# Patient Record
Sex: Male | Born: 1950 | Hispanic: No | Marital: Married | Smoking: Former smoker
Health system: Southern US, Community
[De-identification: ages and names within clinical notes are randomized; demographics above are authoritative.]

## PROBLEM LIST (undated history)

## (undated) DIAGNOSIS — I10 Essential (primary) hypertension: Secondary | ICD-10-CM

## (undated) DIAGNOSIS — I4891 Unspecified atrial fibrillation: Secondary | ICD-10-CM

## (undated) DIAGNOSIS — E785 Hyperlipidemia, unspecified: Secondary | ICD-10-CM

## (undated) DIAGNOSIS — K219 Gastro-esophageal reflux disease without esophagitis: Secondary | ICD-10-CM

## (undated) DIAGNOSIS — N400 Enlarged prostate without lower urinary tract symptoms: Secondary | ICD-10-CM

## (undated) DIAGNOSIS — M1990 Unspecified osteoarthritis, unspecified site: Secondary | ICD-10-CM

## (undated) DIAGNOSIS — R0600 Dyspnea, unspecified: Secondary | ICD-10-CM

## (undated) DIAGNOSIS — D72819 Decreased white blood cell count, unspecified: Secondary | ICD-10-CM

## (undated) HISTORY — PX: OTHER SURGICAL HISTORY: SHX169

---

## 1999-08-05 ENCOUNTER — Encounter: Admission: RE | Admit: 1999-08-05 | Discharge: 1999-08-05 | Payer: Self-pay | Admitting: *Deleted

## 2000-07-17 ENCOUNTER — Ambulatory Visit (HOSPITAL_COMMUNITY): Admission: RE | Admit: 2000-07-17 | Discharge: 2000-07-17 | Payer: Self-pay | Admitting: Gastroenterology

## 2000-07-17 ENCOUNTER — Encounter: Payer: Self-pay | Admitting: Gastroenterology

## 2005-01-22 ENCOUNTER — Ambulatory Visit: Payer: Self-pay | Admitting: Internal Medicine

## 2006-07-31 ENCOUNTER — Other Ambulatory Visit: Payer: Self-pay

## 2006-07-31 ENCOUNTER — Emergency Department: Payer: Self-pay | Admitting: Emergency Medicine

## 2006-10-09 ENCOUNTER — Ambulatory Visit: Payer: Self-pay | Admitting: Unknown Physician Specialty

## 2009-06-04 ENCOUNTER — Ambulatory Visit: Payer: Self-pay | Admitting: Internal Medicine

## 2011-02-10 ENCOUNTER — Ambulatory Visit: Payer: Self-pay | Admitting: Physical Medicine and Rehabilitation

## 2011-09-24 ENCOUNTER — Encounter: Payer: Self-pay | Admitting: Gastroenterology

## 2012-05-19 ENCOUNTER — Encounter: Payer: Self-pay | Admitting: Gastroenterology

## 2012-05-26 ENCOUNTER — Telehealth: Payer: Self-pay | Admitting: Gastroenterology

## 2012-05-26 NOTE — Telephone Encounter (Signed)
pt received recall letter.  He said he just had it done a couple weeks ago at another facility.

## 2014-08-11 ENCOUNTER — Other Ambulatory Visit: Payer: Self-pay | Admitting: Orthopedic Surgery

## 2014-08-11 DIAGNOSIS — M25561 Pain in right knee: Secondary | ICD-10-CM

## 2014-08-18 ENCOUNTER — Other Ambulatory Visit: Payer: Self-pay

## 2014-08-28 ENCOUNTER — Ambulatory Visit
Admission: RE | Admit: 2014-08-28 | Discharge: 2014-08-28 | Disposition: A | Discharge status: Home / Self Care | Payer: BC Managed Care – PPO | Source: Ambulatory Visit | Attending: Orthopedic Surgery | Admitting: Orthopedic Surgery

## 2014-08-28 DIAGNOSIS — M25561 Pain in right knee: Secondary | ICD-10-CM

## 2016-05-14 ENCOUNTER — Other Ambulatory Visit: Payer: Self-pay | Admitting: Student

## 2016-05-14 DIAGNOSIS — M19011 Primary osteoarthritis, right shoulder: Secondary | ICD-10-CM

## 2016-05-14 DIAGNOSIS — M7581 Other shoulder lesions, right shoulder: Secondary | ICD-10-CM

## 2016-05-28 ENCOUNTER — Ambulatory Visit
Admission: RE | Admit: 2016-05-28 | Discharge: 2016-05-28 | Disposition: A | Discharge status: Home / Self Care | Payer: Medicare Other | Source: Ambulatory Visit | Attending: Student | Admitting: Student

## 2016-05-28 DIAGNOSIS — M7581 Other shoulder lesions, right shoulder: Secondary | ICD-10-CM | POA: Diagnosis present

## 2016-05-28 DIAGNOSIS — M19011 Primary osteoarthritis, right shoulder: Secondary | ICD-10-CM | POA: Insufficient documentation

## 2017-10-26 ENCOUNTER — Encounter: Payer: Self-pay | Admitting: *Deleted

## 2017-10-27 ENCOUNTER — Encounter: Payer: Self-pay | Admitting: *Deleted

## 2017-10-27 ENCOUNTER — Ambulatory Visit
Admission: RE | Admit: 2017-10-27 | Discharge: 2017-10-27 | Disposition: A | Discharge status: Home / Self Care | Payer: Medicare Other | Source: Ambulatory Visit | Attending: Unknown Physician Specialty | Admitting: Unknown Physician Specialty

## 2017-10-27 ENCOUNTER — Ambulatory Visit: Payer: Medicare Other | Admitting: Anesthesiology

## 2017-10-27 ENCOUNTER — Encounter
Admission: RE | Disposition: A | Discharge status: Home / Self Care | Payer: Self-pay | Source: Ambulatory Visit | Attending: Unknown Physician Specialty

## 2017-10-27 DIAGNOSIS — I10 Essential (primary) hypertension: Secondary | ICD-10-CM | POA: Diagnosis not present

## 2017-10-27 DIAGNOSIS — Z1211 Encounter for screening for malignant neoplasm of colon: Secondary | ICD-10-CM | POA: Insufficient documentation

## 2017-10-27 DIAGNOSIS — Z8249 Family history of ischemic heart disease and other diseases of the circulatory system: Secondary | ICD-10-CM | POA: Diagnosis not present

## 2017-10-27 DIAGNOSIS — Z79899 Other long term (current) drug therapy: Secondary | ICD-10-CM | POA: Insufficient documentation

## 2017-10-27 DIAGNOSIS — Z96611 Presence of right artificial shoulder joint: Secondary | ICD-10-CM | POA: Insufficient documentation

## 2017-10-27 DIAGNOSIS — Z800 Family history of malignant neoplasm of digestive organs: Secondary | ICD-10-CM | POA: Insufficient documentation

## 2017-10-27 DIAGNOSIS — R001 Bradycardia, unspecified: Secondary | ICD-10-CM | POA: Insufficient documentation

## 2017-10-27 DIAGNOSIS — R351 Nocturia: Secondary | ICD-10-CM | POA: Insufficient documentation

## 2017-10-27 DIAGNOSIS — Z801 Family history of malignant neoplasm of trachea, bronchus and lung: Secondary | ICD-10-CM | POA: Diagnosis not present

## 2017-10-27 DIAGNOSIS — Z87891 Personal history of nicotine dependence: Secondary | ICD-10-CM | POA: Diagnosis not present

## 2017-10-27 DIAGNOSIS — E785 Hyperlipidemia, unspecified: Secondary | ICD-10-CM | POA: Diagnosis not present

## 2017-10-27 DIAGNOSIS — K219 Gastro-esophageal reflux disease without esophagitis: Secondary | ICD-10-CM | POA: Diagnosis not present

## 2017-10-27 DIAGNOSIS — I480 Paroxysmal atrial fibrillation: Secondary | ICD-10-CM | POA: Diagnosis not present

## 2017-10-27 DIAGNOSIS — K640 First degree hemorrhoids: Secondary | ICD-10-CM | POA: Insufficient documentation

## 2017-10-27 DIAGNOSIS — D123 Benign neoplasm of transverse colon: Secondary | ICD-10-CM | POA: Insufficient documentation

## 2017-10-27 DIAGNOSIS — R0600 Dyspnea, unspecified: Secondary | ICD-10-CM | POA: Diagnosis not present

## 2017-10-27 DIAGNOSIS — Z808 Family history of malignant neoplasm of other organs or systems: Secondary | ICD-10-CM | POA: Insufficient documentation

## 2017-10-27 DIAGNOSIS — M1990 Unspecified osteoarthritis, unspecified site: Secondary | ICD-10-CM | POA: Insufficient documentation

## 2017-10-27 DIAGNOSIS — Z86010 Personal history of colonic polyps: Secondary | ICD-10-CM | POA: Insufficient documentation

## 2017-10-27 DIAGNOSIS — N400 Enlarged prostate without lower urinary tract symptoms: Secondary | ICD-10-CM | POA: Insufficient documentation

## 2017-10-27 DIAGNOSIS — Z833 Family history of diabetes mellitus: Secondary | ICD-10-CM | POA: Insufficient documentation

## 2017-10-27 HISTORY — DX: Hyperlipidemia, unspecified: E78.5

## 2017-10-27 HISTORY — PX: ESOPHAGOGASTRODUODENOSCOPY (EGD) WITH PROPOFOL: SHX5813

## 2017-10-27 HISTORY — DX: Unspecified osteoarthritis, unspecified site: M19.90

## 2017-10-27 HISTORY — DX: Benign prostatic hyperplasia without lower urinary tract symptoms: N40.0

## 2017-10-27 HISTORY — DX: Essential (primary) hypertension: I10

## 2017-10-27 HISTORY — DX: Dyspnea, unspecified: R06.00

## 2017-10-27 HISTORY — PX: COLONOSCOPY WITH PROPOFOL: SHX5780

## 2017-10-27 HISTORY — DX: Unspecified atrial fibrillation: I48.91

## 2017-10-27 HISTORY — DX: Gastro-esophageal reflux disease without esophagitis: K21.9

## 2017-10-27 HISTORY — DX: Decreased white blood cell count, unspecified: D72.819

## 2017-10-27 SURGERY — COLONOSCOPY WITH PROPOFOL
Anesthesia: General

## 2017-10-27 MED ORDER — FENTANYL CITRATE (PF) 100 MCG/2ML IJ SOLN
INTRAMUSCULAR | Status: DC | PRN
  Administered 2017-10-27: 25 ug via INTRAVENOUS
  Administered 2017-10-27: 50 ug via INTRAVENOUS
  Administered 2017-10-27: 25 ug via INTRAVENOUS

## 2017-10-27 MED ORDER — PROPOFOL 500 MG/50ML IV EMUL
INTRAVENOUS | Status: DC | PRN
  Administered 2017-10-27: 150 ug/kg/min via INTRAVENOUS

## 2017-10-27 MED ORDER — SODIUM CHLORIDE 0.9 % IV SOLN
INTRAVENOUS | Status: DC
  Administered 2017-10-27: 14:00:00 via INTRAVENOUS

## 2017-10-27 MED ORDER — FENTANYL CITRATE (PF) 100 MCG/2ML IJ SOLN
INTRAMUSCULAR | Status: AC
  Filled 2017-10-27: qty 2

## 2017-10-27 MED ORDER — EPHEDRINE SULFATE 50 MG/ML IJ SOLN
INTRAMUSCULAR | Status: DC | PRN
  Administered 2017-10-27 (×2): 5 mg via INTRAVENOUS

## 2017-10-27 MED ORDER — SODIUM CHLORIDE 0.9 % IJ SOLN
INTRAMUSCULAR | Status: AC
  Filled 2017-10-27: qty 10

## 2017-10-27 MED ORDER — PIPERACILLIN-TAZOBACTAM 3.375 G IVPB 30 MIN
3.3750 g | Freq: Once | INTRAVENOUS | Status: AC
  Administered 2017-10-27: 3.375 g via INTRAVENOUS
  Filled 2017-10-27: qty 50

## 2017-10-27 MED ORDER — LIDOCAINE HCL (PF) 2 % IJ SOLN
INTRAMUSCULAR | Status: AC
  Filled 2017-10-27: qty 10

## 2017-10-27 MED ORDER — PROPOFOL 10 MG/ML IV BOLUS
INTRAVENOUS | Status: DC | PRN
  Administered 2017-10-27: 50 mg via INTRAVENOUS
  Administered 2017-10-27 (×2): 20 mg via INTRAVENOUS

## 2017-10-27 MED ORDER — LIDOCAINE HCL (CARDIAC) 20 MG/ML IV SOLN
INTRAVENOUS | Status: DC | PRN
  Administered 2017-10-27: 50 mg via INTRAVENOUS

## 2017-10-27 MED ORDER — GLYCOPYRROLATE 0.2 MG/ML IV SOSY
PREFILLED_SYRINGE | INTRAVENOUS | Status: DC | PRN
  Administered 2017-10-27: .1 mg via INTRAVENOUS

## 2017-10-27 MED ORDER — PIPERACILLIN-TAZOBACTAM 3.375 G IVPB
INTRAVENOUS | Status: AC
  Administered 2017-10-27: 3.375 g via INTRAVENOUS
  Filled 2017-10-27: qty 50

## 2017-10-27 MED ORDER — PROPOFOL 500 MG/50ML IV EMUL
INTRAVENOUS | Status: AC
  Filled 2017-10-27: qty 50

## 2017-10-27 MED ORDER — GLYCOPYRROLATE 0.2 MG/ML IJ SOLN
INTRAMUSCULAR | Status: AC
  Filled 2017-10-27: qty 1

## 2017-10-27 MED ORDER — EPHEDRINE SULFATE 50 MG/ML IJ SOLN
INTRAMUSCULAR | Status: AC
  Filled 2017-10-27: qty 1

## 2017-10-27 MED ORDER — SODIUM CHLORIDE 0.9 % IV SOLN: INTRAVENOUS | Status: DC

## 2017-10-27 NOTE — Op Note (Signed)
[facility] Patient Name: Jeffery Livingston Procedure Date: 10/27/2017 1:53 PM MRN: [id number] Account #: [id number] Date of Birth: Jan 27, 1951 Admit Type: Outpatient Age: 67 Room: [facility] ENDO ROOM 1 Gender: Male Note Status: Finalized Procedure:            Upper GI endoscopy Indications:          Suspected gastro-esophageal reflux disease Providers:            Manya Silvas, MD Referring MD:         Ramonita Lab, MD (Referring MD) Medicines:            Propofol per Anesthesia Complications:        No immediate complications. Procedure:            Pre-Anesthesia Assessment:                       - After reviewing the risks and benefits, the patient                        was deemed in satisfactory condition to undergo the                        procedure.                       After obtaining informed consent, the endoscope was                        passed under direct vision. Throughout the procedure,                        the patient's blood pressure, pulse, and oxygen                        saturations were monitored continuously. The Endoscope                        was introduced through the mouth, and advanced to the                        second part of duodenum. The upper GI endoscopy was                        accomplished without difficulty. The patient tolerated                        the procedure well. Findings:      The examined esophagus was normal. Biopsies were taken with a cold       forceps for histology. Done at [facility]. GEJ at 41cm.      The entire examined stomach was normal. Biopsies were taken with a cold       forceps for Helicobacter pylori testing. Biopsies were taken with a cold       forceps for histology.      The examined duodenum was normal. Impression:           - Normal esophagus. Biopsied.                       - Normal stomach. Biopsied.                       -  Normal examined duodenum. Recommendation:       -  Await pathology results.                       - Perform a colonoscopy as previously scheduled. Manya Silvas, MD 10/27/2017 2:16:53 PM This report has been signed electronically. Number of Addenda: 0 Note Initiated On: 10/27/2017 1:53 PM      [facility]

## 2017-10-27 NOTE — Anesthesia Post-op Follow-up Note (Signed)
Anesthesia QCDR form completed.        

## 2017-10-27 NOTE — Op Note (Signed)
[name] Patient Name: Jeffery Livingston Procedure Date: 10/27/2017 1:52 PM MRN: [id number] Account #: [id number] Date of Birth: 1951-06-27 Admit Type: Outpatient Age: 67 Room: [facility] ENDO ROOM 1 Gender: Male Note Status: Finalized Procedure:            Colonoscopy Indications:          High risk colon cancer surveillance: Personal history                        of colonic polyps Providers:            Manya Silvas, MD Referring MD:         Ramonita Lab, MD (Referring MD) Medicines:            Propofol per Anesthesia Complications:        No immediate complications. Procedure:            Pre-Anesthesia Assessment:                       - After reviewing the risks and benefits, the patient                        was deemed in satisfactory condition to undergo the                        procedure.                       After obtaining informed consent, the colonoscope was                        passed under direct vision. Throughout the procedure,                        the patient's blood pressure, pulse, and oxygen                        saturations were monitored continuously. The                        Colonoscope was introduced through the anus and                        advanced to the the cecum, identified by appendiceal                        orifice and ileocecal valve. The colonoscopy was                        performed without difficulty. The patient tolerated the                        procedure well. The quality of the bowel preparation                        was good. Findings:      A diminutive polyp was found in the transverse colon. The polyp was       sessile. The polyp was removed with a jumbo cold forceps. Resection and       retrieval were complete.      Internal hemorrhoids were found during  endoscopy. The hemorrhoids were       small and Grade I (internal hemorrhoids that do not prolapse).      The exam was otherwise  without abnormality. Impression:           - One diminutive polyp in the transverse colon, removed                        with a jumbo cold forceps. Resected and retrieved.                       - Internal hemorrhoids.                       - The examination was otherwise normal. Recommendation:       - Await pathology results. Manya Silvas, MD 10/27/2017 2:40:09 PM This report has been signed electronically. Number of Addenda: 0 Note Initiated On: 10/27/2017 1:52 PM Scope Withdrawal Time: 0 hours 10 minutes 23 seconds  Total Procedure Duration: 0 hours 17 minutes 24 seconds       [facility]

## 2017-10-27 NOTE — Transfer of Care (Signed)
Immediate Anesthesia Transfer of Care Note  Patient: Jeffery Livingston  Procedure(s) Performed: COLONOSCOPY WITH PROPOFOL (N/A ) ESOPHAGOGASTRODUODENOSCOPY (EGD) WITH PROPOFOL (N/A )  Patient Location: PACU  Anesthesia Type:General  Level of Consciousness: sedated  Airway & Oxygen Therapy: Patient Spontanous Breathing and Patient connected to nasal cannula oxygen  Post-op Assessment: Report given to RN and Post -op Vital signs reviewed and stable  Post vital signs: Reviewed and stable  Last Vitals:  Vitals Value Taken Time  BP 112/65 10/27/2017  2:43 PM  Temp 36.1 C 10/27/2017  2:43 PM  Pulse 69 10/27/2017  2:43 PM  Resp 15 10/27/2017  2:43 PM  SpO2 100 % 10/27/2017  2:43 PM  Vitals shown include unvalidated device data.  Last Pain:  Vitals:   10/27/17 1443  TempSrc: Tympanic  PainSc: 0-No pain         Complications: No apparent anesthesia complications

## 2017-10-27 NOTE — Anesthesia Procedure Notes (Signed)
Procedure Name: MAC Date/Time: 10/27/2017 2:05 PM Performed by: Rudean Hitt, CRNA Pre-anesthesia Checklist: Patient identified, Emergency Drugs available, Suction available, Patient being monitored and Timeout performed Patient Re-evaluated:Patient Re-evaluated prior to induction Oxygen Delivery Method: Nasal cannula Induction Type: IV induction

## 2017-10-27 NOTE — Anesthesia Preprocedure Evaluation (Signed)
Anesthesia Evaluation  Patient identified by MRN, date of birth, ID band Patient awake    Reviewed: Allergy & Precautions, NPO status , Patient's Chart, lab work & pertinent test results  History of Anesthesia Complications Negative for: history of anesthetic complications  Airway Mallampati: II       Dental   Pulmonary neg sleep apnea, neg COPD, former smoker,           Cardiovascular hypertension, Pt. on medications and Pt. on home beta blockers (-) Past MI and (-) CHF + dysrhythmias Atrial Fibrillation (-) Valvular Problems/Murmurs     Neuro/Psych neg Seizures    GI/Hepatic Neg liver ROS, GERD  Medicated and Controlled,  Endo/Other  neg diabetes  Renal/GU negative Renal ROS     Musculoskeletal   Abdominal   Peds  Hematology   Anesthesia Other Findings   Reproductive/Obstetrics                             Anesthesia Physical Anesthesia Plan  ASA: III  Anesthesia Plan: General   Post-op Pain Management:    Induction: Intravenous  PONV Risk Score and Plan: 2 and Propofol infusion and TIVA  Airway Management Planned: Nasal Cannula  Additional Equipment:   Intra-op Plan:   Post-operative Plan:   Informed Consent: I have reviewed the patients History and Physical, chart, labs and discussed the procedure including the risks, benefits and alternatives for the proposed anesthesia with the patient or authorized representative who has indicated his/her understanding and acceptance.     Plan Discussed with:   Anesthesia Plan Comments:         Anesthesia Quick Evaluation

## 2017-10-27 NOTE — H&P (Signed)
Primary Care Physician:  Adin Hector, MD Primary Gastroenterologist:  Dr. Vira Agar  Pre-Procedure History & Physical: HPI:  Jeffery Livingston is a 67 y.o. male is here for an endoscopy and colonoscopy. Doing these for PH colon polyps and FH colon cancer in a brother as well as GERD for the EGD.   Past Medical History:  Diagnosis Date  . AF (atrial fibrillation) ([place])   . Arthritis   . BPH (benign prostatic hyperplasia)   . Dyspnea   . Elevated lipids   . GERD (gastroesophageal reflux disease)   . Hypertension   . Leukopenia     Past Surgical History:  Procedure Laterality Date  . total shoulder relacement      Prior to Admission medications   Medication Sig Start Date End Date Taking? Authorizing Provider  apixaban (ELIQUIS) 5 MG TABS tablet Take 5 mg by mouth 2 (two) times daily.   Yes [provider]  dexlansoprazole (DEXILANT) 60 MG capsule Take 60 mg by mouth daily.   Yes [provider]  metoprolol succinate (TOPROL-XL) 50 MG 24 hr tablet Take 50 mg by mouth daily. Take with or immediately following a meal.   Yes [provider]  tamsulosin (FLOMAX) 0.4 MG CAPS capsule Take 0.4 mg by mouth.   Yes [provider]  azelastine (ASTELIN) 0.1 % nasal spray Place 1 spray into both nostrils 2 (two) times daily. Use in each nostril as directed    [provider]  cholecalciferol (D-VI-SOL) 400 UNIT/ML LIQD Take 400 Units by mouth daily.    [provider]    Allergies as of 10/23/2017  . (Not on File)    History reviewed. No pertinent family history.  Social History   Socioeconomic History  . Marital status: Unknown    Spouse name: Not on file  . Number of children: Not on file  . Years of education: Not on file  . Highest education level: Not on file  Occupational History  . Not on file  Social Needs  . Financial resource strain: Not on file  . Food insecurity:    Worry: Not on file    Inability: Not on  file  . Transportation needs:    Medical: Not on file    Non-medical: Not on file  Tobacco Use  . Smoking status: Former Smoker    Last attempt to quit: 12/21/1978    Years since quitting: 38.8  . Smokeless tobacco: Never Used  Substance and Sexual Activity  . Alcohol use: Never    Frequency: Never  . Drug use: Not on file  . Sexual activity: Not on file  Lifestyle  . Physical activity:    Days per week: Not on file    Minutes per session: Not on file  . Stress: Not on file  Relationships  . Social connections:    Talks on phone: Not on file    Gets together: Not on file    Attends religious service: Not on file    Active member of club or organization: Not on file    Attends meetings of clubs or organizations: Not on file    Relationship status: Not on file  . Intimate partner violence:    Fear of current or ex partner: Not on file    Emotionally abused: Not on file    Physically abused: Not on file    Forced sexual activity: Not on file  Other Topics Concern  . Not on file  Social History Narrative  . Not on file    Review of Systems: See HPI, otherwise negative ROS  Physical Exam: BP 140/90   Pulse (!) 53   Temp 97.7 F (36.5 C) (Tympanic)   Resp 18   Ht 5\' 9"  (1.753 m)   Wt 74.8 kg (165 lb)   BMI 24.37 kg/m  General:   Alert,  pleasant and cooperative in NAD Head:  Normocephalic and atraumatic. Neck:  Supple; no masses or thyromegaly. Lungs:  Clear throughout to auscultation.    Heart:  Regular rate and rhythm. Abdomen:  Soft, nontender and nondistended. Normal bowel sounds, without guarding, and without rebound.   Neurologic:  Alert and  oriented x4;  grossly normal neurologically.  Impression/Plan: [name] is here for an endoscopy and colonoscopy to be performed for [facility] of colon polyps and FH colon cancer in a brother and GERD.  Risks, benefits, limitations, and alternatives regarding  endoscopy and colonoscopy have been reviewed with the  patient.  Questions have been answered.  All parties agreeable.   Gaylyn Cheers, MD  10/27/2017, 1:52 PM

## 2017-10-27 NOTE — Anesthesia Postprocedure Evaluation (Signed)
Anesthesia Post Note  Patient: NEYLAND PETTENGILL  Procedure(s) Performed: COLONOSCOPY WITH PROPOFOL (N/A ) ESOPHAGOGASTRODUODENOSCOPY (EGD) WITH PROPOFOL (N/A )  Patient location during evaluation: Endoscopy Anesthesia Type: General Level of consciousness: awake and alert Pain management: pain level controlled Vital Signs Assessment: post-procedure vital signs reviewed and stable Respiratory status: spontaneous breathing and respiratory function stable Cardiovascular status: stable Anesthetic complications: no     Last Vitals:  Vitals:   10/27/17 1332 10/27/17 1443  BP: 140/90 112/65  Pulse: (!) 53   Resp: 18 16  Temp: 36.5 C (!) 36.1 C    Last Pain:  Vitals:   10/27/17 1453  TempSrc:   PainSc: 0-No pain                 Cadin Luka K

## 2017-10-29 ENCOUNTER — Encounter: Payer: Self-pay | Admitting: Unknown Physician Specialty

## 2017-10-30 LAB — SURGICAL PATHOLOGY

## 2018-02-27 IMAGING — MR MR SHOULDER*R* W/O CM
5 series · 40 of 40 positions shown · non-contrast
Comparison: None.

CLINICAL DATA: Limited range of motion.  Right shoulder pain

EXAM:
MRI OF THE RIGHT SHOULDER WITHOUT CONTRAST
TECHNIQUE: Multiplanar, multisequence MR imaging of the shoulder was performed.
No intravenous contrast was administered.

[Series 3: T2 fat-sat · axial · 4.0mm · 0.47mm/px · z∈[-40,+65]mm · 8 of 25 slices shown (1 of 3)]
[im 1/25]
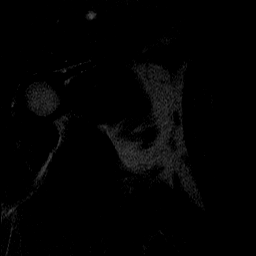
[im 4/25]
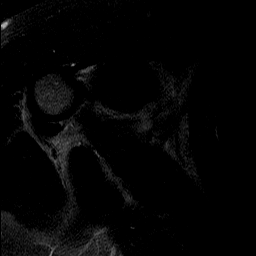
[im 7/25]
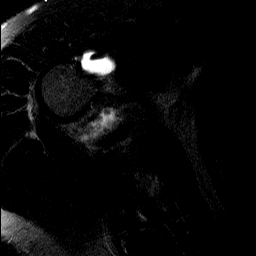
[im 11/25]
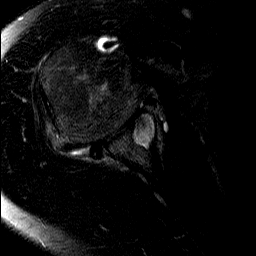
[im 14/25]
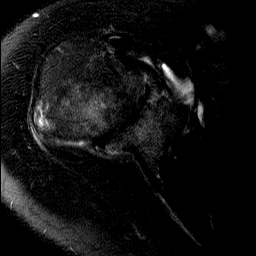
[im 18/25]
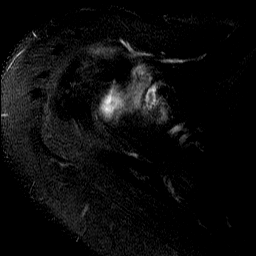
[im 21/25]
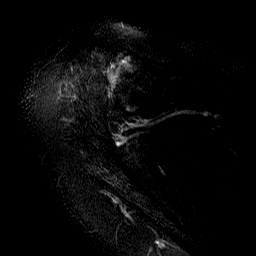
[im 25/25]
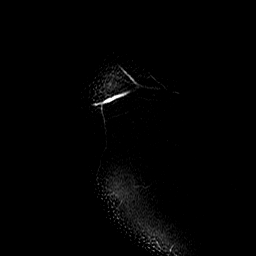

[Series 4: T2 fat-sat · oblique · 4.0mm · 0.62mm/px · 8 of 23 slices shown (2 of 3)]
[im 1/23]
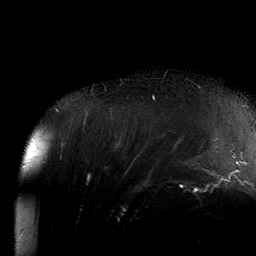
[im 4/23]
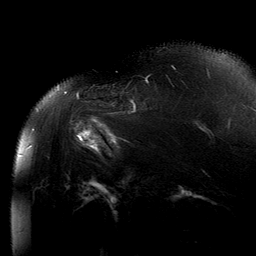
[im 7/23]
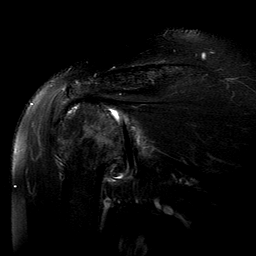
[im 10/23]
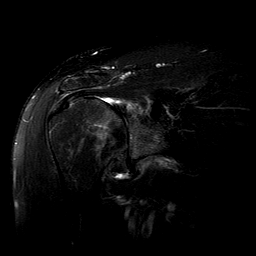
[im 13/23]
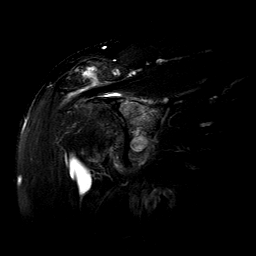
[im 16/23]
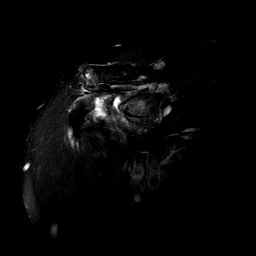
[im 19/23]
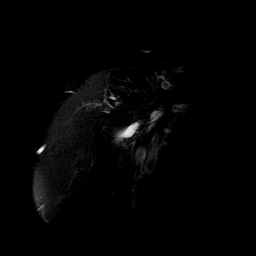
[im 23/23]
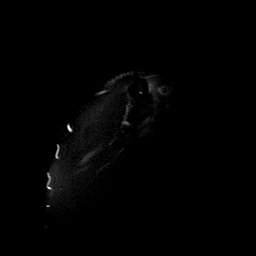

[Series 5: PD · oblique · 4.0mm · 0.62mm/px · 8 of 23 slices shown]
[im 1/23]
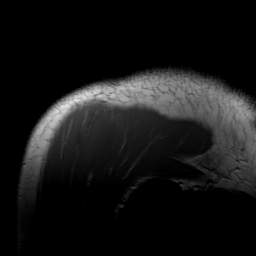
[im 4/23]
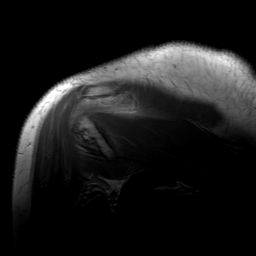
[im 7/23]
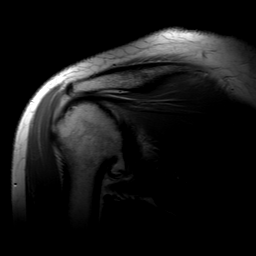
[im 10/23]
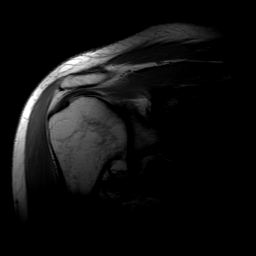
[im 13/23]
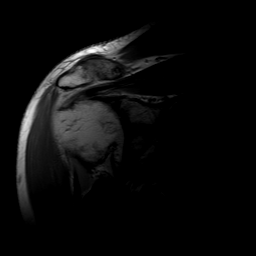
[im 16/23]
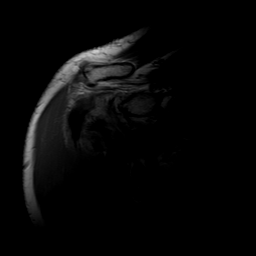
[im 19/23]
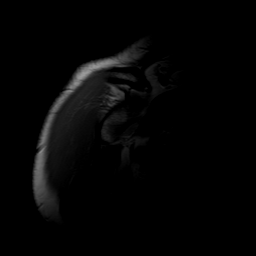
[im 23/23]
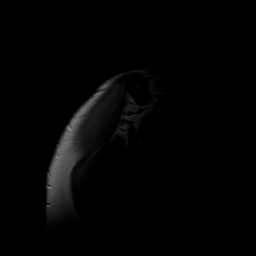

[Series 6: T1 · oblique · 4.0mm · 0.62mm/px · 8 of 23 slices shown]
[im 1/23]
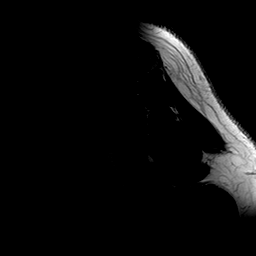
[im 4/23]
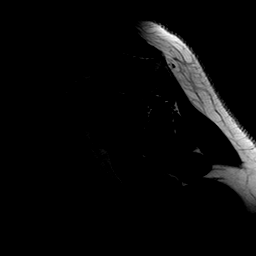
[im 7/23]
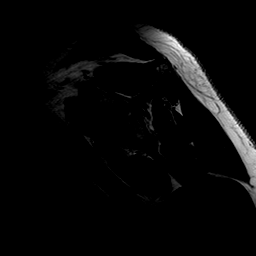
[im 10/23]
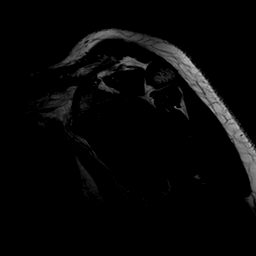
[im 13/23]
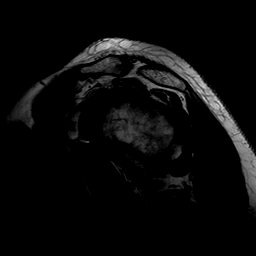
[im 16/23]
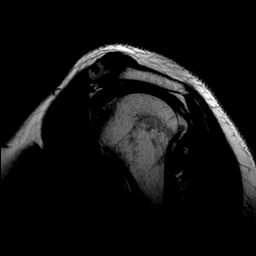
[im 19/23]
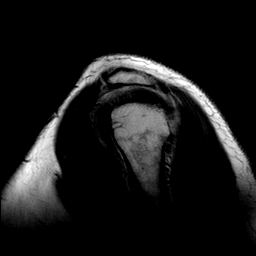
[im 23/23]
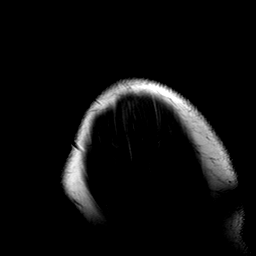

[Series 7: T2 fat-sat · oblique · 4.0mm · 0.62mm/px · 8 of 23 slices shown (3 of 3)]
[im 1/23]
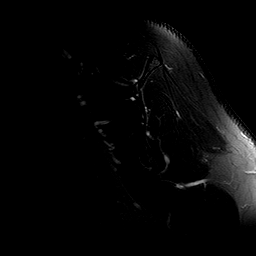
[im 4/23]
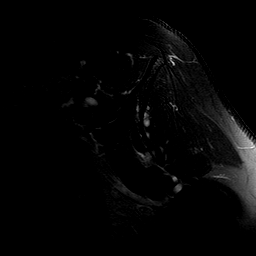
[im 7/23]
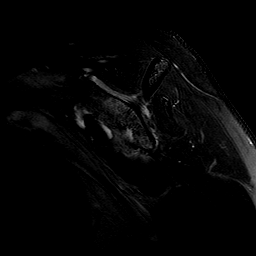
[im 10/23]
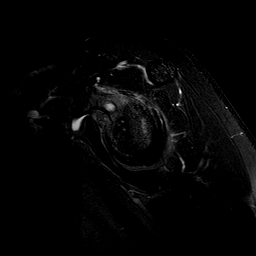
[im 13/23]
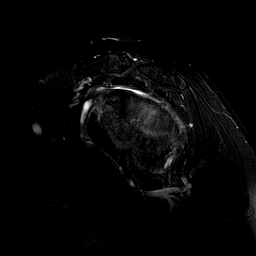
[im 16/23]
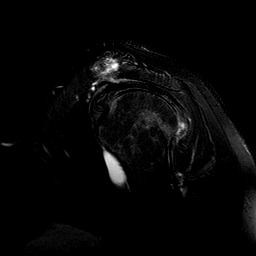
[im 19/23]
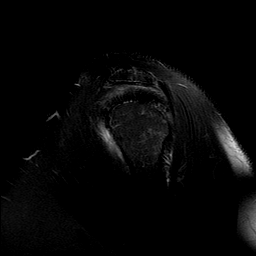
[im 23/23]
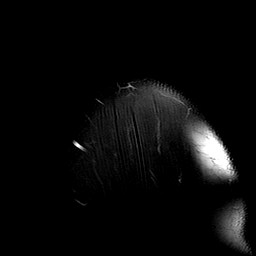

[40 of 40 positions shown; findings below may reference images not displayed]

FINDINGS: Rotator cuff: Mild tendinosis of the supraspinatus tendon.
Infraspinatus tendon is intact. Teres minor tendon is intact.
Subscapularis tendon is intact.

Muscles: No atrophy or fatty replacement of nor abnormal signal
within, the muscles of the rotator cuff.

Biceps long head: Mild tendinosis of the intraarticular portion of
the long head of the biceps tendon.

Acromioclavicular Joint: Mild arthropathy of the acromioclavicular
joint. Type II acromion. Small amount of subacromial/subdeltoid
bursal fluid.

Glenohumeral Joint: Small joint effusion. Extensive full-thickness
cartilage loss of the glenohumeral joint with subchondral reactive
marrow changes most severe in the glenoid. Subchondral cystic
changes in the glenoid with the largest subchondral glenoid cyst
measuring 12.2 x 16.9 mm along the anterior aspect of the glenoid.

Labrum:  Degeneration of the labrum with a posterior labral tear.

Bones: No acute fracture or dislocation. No aggressive osseous
lesion.

Other: No fluid collection or hematoma.
IMPRESSION: 1. Severe osteoarthritis of the right glenohumeral joint as
described above.
2. Mild tendinosis of the supraspinatus tendon.
3. Mild tendinosis of the intraarticular portion of the long head of
the biceps tendon.

## 2021-04-19 ENCOUNTER — Other Ambulatory Visit: Payer: Self-pay | Admitting: Sports Medicine

## 2021-04-22 ENCOUNTER — Other Ambulatory Visit: Payer: Self-pay

## 2021-04-22 ENCOUNTER — Ambulatory Visit
Admission: RE | Admit: 2021-04-22 | Discharge: 2021-04-22 | Disposition: A | Discharge status: Home / Self Care | Payer: Medicare PPO | Source: Ambulatory Visit | Attending: Sports Medicine | Admitting: Sports Medicine

## 2021-04-22 DIAGNOSIS — R609 Edema, unspecified: Secondary | ICD-10-CM | POA: Insufficient documentation

## 2021-04-22 DIAGNOSIS — M25422 Effusion, left elbow: Secondary | ICD-10-CM | POA: Insufficient documentation

## 2021-04-22 DIAGNOSIS — M25522 Pain in left elbow: Secondary | ICD-10-CM | POA: Diagnosis present

## 2021-04-23 ENCOUNTER — Ambulatory Visit: Payer: TRICARE For Life (TFL)

## 2022-06-30 ENCOUNTER — Emergency Department: Payer: Medicare PPO

## 2022-06-30 ENCOUNTER — Encounter: Payer: Self-pay | Admitting: Emergency Medicine

## 2022-06-30 ENCOUNTER — Emergency Department
Admission: EM | Admit: 2022-06-30 | Discharge: 2022-06-30 | Disposition: A | Discharge status: Home / Self Care | Payer: TRICARE For Life (TFL) | Attending: Emergency Medicine | Admitting: Emergency Medicine

## 2022-06-30 ENCOUNTER — Other Ambulatory Visit: Payer: Self-pay

## 2022-06-30 DIAGNOSIS — W19XXXA Unspecified fall, initial encounter: Secondary | ICD-10-CM | POA: Diagnosis not present

## 2022-06-30 DIAGNOSIS — Y92812 Truck as the place of occurrence of the external cause: Secondary | ICD-10-CM | POA: Diagnosis not present

## 2022-06-30 DIAGNOSIS — S22020A Wedge compression fracture of second thoracic vertebra, initial encounter for closed fracture: Secondary | ICD-10-CM | POA: Diagnosis not present

## 2022-06-30 DIAGNOSIS — I4891 Unspecified atrial fibrillation: Secondary | ICD-10-CM | POA: Insufficient documentation

## 2022-06-30 DIAGNOSIS — Y30XXXA Falling, jumping or pushed from a high place, undetermined intent, initial encounter: Secondary | ICD-10-CM | POA: Diagnosis not present

## 2022-06-30 DIAGNOSIS — S299XXA Unspecified injury of thorax, initial encounter: Secondary | ICD-10-CM | POA: Insufficient documentation

## 2022-06-30 DIAGNOSIS — S12130A Unspecified traumatic displaced spondylolisthesis of second cervical vertebra, initial encounter for closed fracture: Secondary | ICD-10-CM

## 2022-06-30 DIAGNOSIS — S0990XA Unspecified injury of head, initial encounter: Secondary | ICD-10-CM | POA: Diagnosis not present

## 2022-06-30 DIAGNOSIS — R202 Paresthesia of skin: Secondary | ICD-10-CM | POA: Diagnosis not present

## 2022-06-30 DIAGNOSIS — I10 Essential (primary) hypertension: Secondary | ICD-10-CM | POA: Insufficient documentation

## 2022-06-30 DIAGNOSIS — Z7901 Long term (current) use of anticoagulants: Secondary | ICD-10-CM | POA: Insufficient documentation

## 2022-06-30 DIAGNOSIS — S12101A Unspecified nondisplaced fracture of second cervical vertebra, initial encounter for closed fracture: Secondary | ICD-10-CM | POA: Insufficient documentation

## 2022-06-30 DIAGNOSIS — S199XXA Unspecified injury of neck, initial encounter: Secondary | ICD-10-CM | POA: Diagnosis present

## 2022-06-30 LAB — CBC WITH DIFFERENTIAL/PLATELET
Abs Immature Granulocytes: 0.01 10*3/uL (ref 0.00–0.07)
Basophils Absolute: 0 10*3/uL (ref 0.0–0.1)
Basophils Relative: 0 %
Eosinophils Absolute: 0.1 10*3/uL (ref 0.0–0.5)
Eosinophils Relative: 2 %
HCT: 43.3 % (ref 39.0–52.0)
Hemoglobin: 14.5 g/dL (ref 13.0–17.0)
Immature Granulocytes: 0 %
Lymphocytes Relative: 19 %
Lymphs Abs: 1.3 10*3/uL (ref 0.7–4.0)
MCH: 31.2 pg (ref 26.0–34.0)
MCHC: 33.5 g/dL (ref 30.0–36.0)
MCV: 93.1 fL (ref 80.0–100.0)
Monocytes Absolute: 0.4 10*3/uL (ref 0.1–1.0)
Monocytes Relative: 6 %
Neutro Abs: 4.8 10*3/uL (ref 1.7–7.7)
Neutrophils Relative %: 73 %
Platelets: 233 10*3/uL (ref 150–400)
RBC: 4.65 MIL/uL (ref 4.22–5.81)
RDW: 11.9 % (ref 11.5–15.5)
WBC: 6.6 10*3/uL (ref 4.0–10.5)
nRBC: 0 % (ref 0.0–0.2)

## 2022-06-30 LAB — COMPREHENSIVE METABOLIC PANEL
ALT: 19 U/L (ref 0–44)
AST: 27 U/L (ref 15–41)
Albumin: 3.8 g/dL (ref 3.5–5.0)
Alkaline Phosphatase: 64 U/L (ref 38–126)
Anion gap: 5 (ref 5–15)
BUN: 16 mg/dL (ref 8–23)
CO2: 30 mmol/L (ref 22–32)
Calcium: 8.9 mg/dL (ref 8.9–10.3)
Chloride: 104 mmol/L (ref 98–111)
Creatinine, Ser: 1.02 mg/dL (ref 0.61–1.24)
GFR, Estimated: >60 mL/min (ref >60)
Glucose, Bld: 102 mg/dL — ABNORMAL HIGH (ref 70–99)
Potassium: 3.8 mmol/L (ref 3.5–5.1)
Sodium: 139 mmol/L (ref 135–145)
Total Bilirubin: 0.8 mg/dL (ref 0.3–1.2)
Total Protein: 7.3 g/dL (ref 6.5–8.1)

## 2022-06-30 MED ORDER — CYCLOBENZAPRINE HCL 5 MG PO TABS: 5.0000 mg | ORAL_TABLET | Freq: Three times a day (TID) | ORAL | 0 refills | Status: DC | PRN

## 2022-06-30 MED ORDER — OXYCODONE-ACETAMINOPHEN 5-325 MG PO TABS: 1.0000 | ORAL_TABLET | ORAL | 0 refills | Status: DC | PRN

## 2022-06-30 MED ORDER — IOHEXOL 350 MG/ML SOLN
75.0000 mL | Freq: Once | INTRAVENOUS | Status: AC | PRN
  Administered 2022-06-30: 75 mL via INTRAVENOUS

## 2022-06-30 NOTE — ED Provider Notes (Addendum)
[facility] Provider Note    Event Date/Time   First MD Initiated Contact with Patient 06/30/22 [phone]     (approximate)   History   Neck Pain   HPI  Jeffery Livingston is a 71 y.o. male A-fib on Eliquis hypertension who presents with neck pain.  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  He fell onto his head and then tumbled forward.  Did not seek care initially.  He has had ongoing neck pain.  Denies visual change numbness tingling weakness other than occasional left hand numbness when he is using the hand but none at rest denies lower extremity symptoms denies chest pain or dyspnea.  He is anticoagulated.     Past Medical History:  Diagnosis Date   AF (atrial fibrillation) ([facility])    Arthritis    BPH (benign prostatic hyperplasia)    Dyspnea    Elevated lipids    GERD (gastroesophageal reflux disease)    Hypertension    Leukopenia     There are no problems to display for this patient.    Physical Exam  Triage Vital Signs: ED Triage Vitals  Enc Vitals Group     BP 06/30/22 0811 (!) 152/73     Pulse Rate 06/30/22 0811 63     Resp 06/30/22 0811 18     Temp 06/30/22 0811 97.6 F (36.4 C)     Temp Source 06/30/22 0811 Oral     SpO2 06/30/22 0811 100 %     Weight 06/30/22 0802 164 lb 14.5 oz (74.8 kg)     Height 06/30/22 0802 '5\' 9"'$  (1.753 m)     Head Circumference --      Peak Flow --      Pain Score 06/30/22 0801 7     Pain Loc --      Pain Edu? --      Excl. in [place]? --     Most recent vital signs: Vitals:   06/30/22 0811 06/30/22 1213  BP: (!) 152/73 (!) 148/70  Pulse: 63 60  Resp: 18 18  Temp: 97.6 F (36.4 C) 98 F (36.7 C)  SpO2: 100% 99%     General: Awake, no distress.  CV:  Good peripheral perfusion.  Resp:  Normal effort.  Abd:  No distention.  Neuro:              Awake, Alert, Oriented x 3  Other:   abrasion on the top of the head, small abrasion on the skin above the upper lip Soft cervical collar in place, placed by the patient, + midline cervical spine tenderness No T or L-spine tenderness 5/5 strength with grip, elbow flexion extension wrist extension Sensation gross intact in bilateral upper extremities No chest wall tenderness Abdomen is soft nontender Pelvis is stable nontender  ED Results / Procedures / Treatments  Labs (all labs ordered are listed, but only abnormal results are displayed) Labs Reviewed  COMPREHENSIVE METABOLIC PANEL - Abnormal; Notable for the following components:      Result Value   Glucose, Bld 102 (*)    All other components within normal limits  CBC WITH DIFFERENTIAL/PLATELET     EKG     RADIOLOGY I reviewed and interpreted the CT scan of the brain which does not show any  acute intracranial process    PROCEDURES:  Critical Care performed: Yes, see critical care procedure note(s)  .Critical Care  Performed by: Rada Hay, MD Authorized by: Rada Hay, MD   Critical care provider statement:    Critical care time (minutes):  30   Critical care was time spent personally by me on the following activities:  Development of treatment plan with patient or surrogate, discussions with consultants, evaluation of patient's response to treatment, examination of patient, ordering and review of laboratory studies, ordering and review of radiographic studies, ordering and performing treatments and interventions, pulse oximetry, re-evaluation of patient's condition and review of old charts   The patient is on the cardiac monitor to evaluate for evidence of arrhythmia and/or significant heart rate changes.   MEDICATIONS ORDERED IN ED: Medications  iohexol (OMNIPAQUE) 350 MG/ML injection 75 mL (75 mLs Intravenous Contrast Given 06/30/22 1049)     IMPRESSION / MDM / ASSESSMENT AND PLAN / ED COURSE   I reviewed the triage vital signs and the nursing notes.                              Patient's presentation is most consistent with acute presentation with potential threat to life or bodily function.  Differential diagnosis includes, but is not limited to, cervical spine fracture, ligamentous cervical injury, thoracic spine fracture, contusion, sprain  The patient is a 71 year old male presents after a significant fall 5 days ago.  Mechanism somewhat unclear to me but patient describes standing on top of a ridge which she had jumped onto from his dump truck and then fell about 12 feet onto his head doing a somersault.  His main complaint is neck pain.  Denies numbness tingling or weakness in his arms now but is occasionally having some numbness in the hand with movement of the hand and arm.  He has a soft collar in place which he got from home.  Does have midline C-spine tenderness he is neurologically intact otherwise without chest wall tenderness abdominal tenderness.   CT C-spine obtained from triage shows bilateral pars fractures C2 spinous process fracture and T2 compression fracture.  There is also nondisplaced posterior first rib fracture.  I obtained CT of the head and CT angio of the head and neck to rule out arterial injury and there is none.  Also obtained a CT of the chest given the first rib fracture and there is no other injuries noted.  Radiology did not see the rib fracture on the read of the chest but I did discuss with the radiologist then after revisiting the imaging there is a very subtle nondisplaced first rib fracture that is posterior.   I have reached out to Dr. Cari Caraway who is in surgery.  Will obtain MRI of the C-spine in the meantime.  Dr. Cari Caraway agrees with MRI C-spine.  This will determine further planning.  Patient signed out to oncoming rider pending neurosurgery recommendations.       FINAL CLINICAL IMPRESSION(S) / ED DIAGNOSES   Final diagnoses:   Closed nondisplaced fracture of second cervical vertebra, unspecified fracture morphology, initial encounter ([place])     Rx / DC Orders   ED Discharge Orders     None        Note:  This document was prepared using Dragon voice recognition software and may include unintentional dictation errors.   Rada Hay, MD 06/30/22 [place],  Jennette Dubin, MD 06/30/22 1501

## 2022-06-30 NOTE — ED Provider Triage Note (Signed)
Emergency Medicine Provider Triage Evaluation Note  Jeffery Livingston , a 71 y.o. male  was evaluated in triage.  Pt complains of neck pain for last 4 to 5 days after a fall.  Patient states that he fell forward while on a ridge.  Over-the-counter medication without any relief. Review of Systems  Positive: Positive cervical pain.  Occasional radiculopathy left upper extremity. Negative: Negative for constant paresthesias or radiculopathy.  Physical Exam  BP (!) 152/73 (BP Location: Left Arm)   Pulse 63   Temp 97.6 F (36.4 C) (Oral)   Resp 18   Ht '5\' 9"'$  (1.753 m)   Wt 74.8 kg   SpO2 100%   BMI 24.35 kg/m  Gen:   Awake, no distress.  Slow guarded movement. Resp:  Normal effort lungs are clear. MSK:   Moves extremities without difficulty.  Good muscle strength upper extremities 5 over 70-monthgrip strength is equal bilaterally. Other:    Medical Decision Making  Medically screening exam initiated at 8:15 AM.  Appropriate orders placed.  SElsworth Sohowas informed that the remainder of the evaluation will be completed by another provider, this initial triage assessment does not replace that evaluation, and the importance of remaining in the ED until their evaluation is complete.     SJohnn Hai PA-C 06/30/22 [phone]

## 2022-06-30 NOTE — ED Triage Notes (Signed)
States fell last Wednesday.  C/O persistent neck pain since that time.  States was seen by a chiropractor, but pain not improved.  Arrives wearing a soft C-collar, patient states collar is his wife's collar.  AAOx3.  Skin warm and dry. NAD

## 2022-06-30 NOTE — Consult Note (Signed)
Consult requested by:  Dr. Starleen Blue  Consult requested for:  C2 fracture  Primary Physician:  Adin Hector, MD  History of Present Illness: 06/30/2022 Mr. Ollivander Mccollam is here today with a chief complaint of a fall 5 days ago.  He had a significant fall and immediate onset of neck pain.  He has had some tingling in his left arm with activity but is otherwise at baseline.  He has severe neck pain with flexion.  I have utilized the care everywhere function in epic to review the outside records available from external health systems.  Review of Systems:  A 10 point review of systems is negative, except for the pertinent positives and negatives detailed in the HPI.  Past Medical History: Past Medical History:  Diagnosis Date   AF (atrial fibrillation) (HCC)    Arthritis    BPH (benign prostatic hyperplasia)    Dyspnea    Elevated lipids    GERD (gastroesophageal reflux disease)    Hypertension    Leukopenia     Past Surgical History: Past Surgical History:  Procedure Laterality Date   COLONOSCOPY WITH PROPOFOL N/A 10/27/2017   Procedure: COLONOSCOPY WITH PROPOFOL;  Surgeon: Manya Silvas, MD;  Location: [facility] ENDOSCOPY;  Service: Endoscopy;  Laterality: N/A;   ESOPHAGOGASTRODUODENOSCOPY (EGD) WITH PROPOFOL N/A 10/27/2017   Procedure: ESOPHAGOGASTRODUODENOSCOPY (EGD) WITH PROPOFOL;  Surgeon: Manya Silvas, MD;  Location: [facility] ENDOSCOPY;  Service: Endoscopy;  Laterality: N/A;   total shoulder relacement      Allergies: Allergies as of 06/30/2022   (No Known Allergies)    Medications: No outpatient medications have been marked as taking for the 06/30/22 encounter [facility] Encounter).    Social History: Social History   Tobacco Use   Smoking status: Former    Types: Cigarettes    Quit date: 12/21/1978    Years since quitting: 43.5   Smokeless tobacco: Never  Substance Use Topics   Alcohol use: Never    Family Medical History: History reviewed. No  pertinent family history.  Physical Examination: Vitals:   06/30/22 1213 06/30/22 1523  BP: (!) 148/70 (!) 142/82  Pulse: 60 66  Resp: 18 18  Temp: 98 F (36.7 C) 98.4 F (36.9 C)  SpO2: 99% 99%    General: Patient is well developed, well nourished, calm, collected, and in no apparent distress. Attention to examination is appropriate.  Neck:   In collar  Respiratory: Patient is breathing without any difficulty.   NEUROLOGICAL:     Awake, alert, oriented to person, place, and time.  Speech is clear and fluent. Fund of knowledge is appropriate.   Cranial Nerves: Pupils equal round and reactive to light.  Facial tone is symmetric.  Facial sensation is symmetric. Shoulder shrug is symmetric. Tongue protrusion is midline.  There is no pronator drift.  ROM of spine: full.    Strength: Side Biceps Triceps Deltoid Interossei Grip Wrist Ext. Wrist Flex.  R '5 5 5 5 5 5 5  '$ L '5 5 5 5 5 5 5   '$ Side Iliopsoas Quads Hamstring PF DF EHL  R '5 5 5 5 5 5  '$ L '5 5 5 5 5 5   '$ Reflexes are 1+ and symmetric at the biceps, triceps, brachioradialis, patella and achilles.   Hoffman's is absent.   Bilateral upper and lower extremity sensation is intact to light touch.    No evidence of dysmetria noted.  Gait is untested.     Medical Decision Making  Imaging: MRI C spine 06/30/22   IMPRESSION: 1. Acute bilateral C2 pars fractures (with involvement of the right transverse foramen), and acute C2 spinous process fracture, better delineated on the cervical spine CT performed earlier today. 2. Acute T2 superior endplate vertebral compression fracture (20% height loss). 3. Marrow edema within the superior aspect of the T1 vertebral body (abutting the endplate), which may reflect a nondisplaced acute compression fracture or bone contusion. 4. STIR hyperintense signal abnormality in the region of the nuchal, interspinous and supraspinous ligaments extending from the skull base to the C4-C5  level, suggesting ligamentous injury. 5. Small right atlantoaxial joint effusion. 6. Cervical spondylosis, as outlined. 7. Mild C5-C6 grade 1 retrolisthesis.     Electronically Signed   By: Kellie Simmering D.O.   On: 06/30/2022 14:51  CT C spine 06/30/2022 IMPRESSION: 1. Acute bilateral C2 pars fractures with involvement of the transverse foramen on the right. Neck CTA is recommended to assess for potential vertebral artery injury. 2. C2 spinous process fracture. 3. T2 compression fracture with 15% height loss. 4. Nondisplaced posterior left first rib fracture.   These results were called by telephone at the time of interpretation on 06/30/2022 at 8:56 am to provider [facility] , who verbally acknowledged these results.     Electronically Signed   By: Logan Bores M.D.   On: 06/30/2022 08:57  CTA Head and Neck 06/30/2022 IMPRESSION: 1. No acute head CT finding. 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 Mild chronic small-vessel change of the white matter. 2. Normal CT angiography of the head and neck.     Electronically Signed   By: Nelson Chimes M.D.   On: 06/30/2022 11:27  I have personally reviewed the images and agree with the above interpretation.  Assessment and Plan: Mr. Halbert is a pleasant 71 y.o. male with Effendi Type 1 Hangman's fracture and minor T2 compression fracture.  Given his normal alignment after several days without bracing, I think the T2 signal on the MRI more likely represents strain. He has no C2-3 disc injury.  We will try a trial of bracing.  I set an appointment for him in 10 days.    I have communicated my recommendations to the requesting physician and coordinated care to facilitate these recommendations.     Crue Otero K. Izora Ribas MD, [facility] Neurosurgery

## 2022-06-30 NOTE — ED Provider Notes (Signed)
Patient received in signout from Dr. Starleen Blue pending follow-up with neurosurgery consultation.  Patient has been evaluated by neurosurgery at bedside.  Patient will be appropriate for outpatient follow-up.  Will prescribe prescription for pain medication as well as muscle relaxants.  Patient agreeable to plan.   Merlyn Lot, MD 06/30/22 [phone]

## 2022-07-01 ENCOUNTER — Telehealth: Payer: Self-pay

## 2022-07-01 ENCOUNTER — Other Ambulatory Visit: Payer: Self-pay | Admitting: Neurosurgery

## 2022-07-01 MED ORDER — CYCLOBENZAPRINE HCL 5 MG PO TABS: 5.0000 mg | ORAL_TABLET | Freq: Three times a day (TID) | ORAL | 0 refills | Status: AC | PRN

## 2022-07-01 NOTE — Telephone Encounter (Signed)
-----   Message from Peggyann Shoals sent at 07/01/2022 12:10 PM EST ----- Regarding: ER medications Contact: [phone] Patient left voice mail that [place] called in 2 medications last night for him to [facility]. He only got the oxycodone and he was supposed to get 2 medications. Can you call in Flexeril that is the one he is missing.

## 2022-07-01 NOTE — Telephone Encounter (Signed)
Neither medications were ordered by Dr Izora Ribas. Both were ordered by Dr Quentin Cornwall. The cyclobenzaprine was a printed rx, which may be why the pharmacy doesn't have it.

## 2022-07-01 NOTE — Telephone Encounter (Signed)
Are you willing to prescribe cyclobenzaprine for him?

## 2022-07-01 NOTE — Telephone Encounter (Signed)
Patient notified of medication fill

## 2022-07-09 ENCOUNTER — Other Ambulatory Visit: Payer: Self-pay

## 2022-07-09 DIAGNOSIS — S12130A Unspecified traumatic displaced spondylolisthesis of second cervical vertebra, initial encounter for closed fracture: Secondary | ICD-10-CM

## 2022-07-09 NOTE — Progress Notes (Unsigned)
Referring Physician:  Adin Hector, MD [place] [employer],  [place] [address]  Primary Physician:  Adin Hector, MD  History of Present Illness: 07/10/2022 [name] is here today with a chief complaint of  neck pain after a fall.  Seen in the ER on 06/30/22.  He has been wearing his brace.  His neck pain is much better.  06/30/2022 [name] is here today with a chief complaint of a fall 5 days ago.  He had a significant fall and immediate onset of neck pain.  He has had some tingling in his left arm with activity but is otherwise at baseline.  He has severe neck pain with flexion.   I have utilized the care everywhere function in epic to review the outside records available from external health systems.  Review of Systems:  A 10 point review of systems is negative, except for the pertinent positives and negatives detailed in the HPI.  Past Medical History: Past Medical History:  Diagnosis Date   AF (atrial fibrillation) (HCC)    Arthritis    BPH (benign prostatic hyperplasia)    Dyspnea    Elevated lipids    GERD (gastroesophageal reflux disease)    Hypertension    Leukopenia     Past Surgical History: Past Surgical History:  Procedure Laterality Date   COLONOSCOPY WITH PROPOFOL N/A 10/27/2017   Procedure: COLONOSCOPY WITH PROPOFOL;  Surgeon: Manya Silvas, MD;  Location: [facility] ENDOSCOPY;  Service: Endoscopy;  Laterality: N/A;   ESOPHAGOGASTRODUODENOSCOPY (EGD) WITH PROPOFOL N/A 10/27/2017   Procedure: ESOPHAGOGASTRODUODENOSCOPY (EGD) WITH PROPOFOL;  Surgeon: Manya Silvas, MD;  Location: [facility] ENDOSCOPY;  Service: Endoscopy;  Laterality: N/A;   total shoulder relacement      Allergies: Allergies as of 07/10/2022   (No Known Allergies)    Medications: Current Meds  Medication Sig   amLODipine (NORVASC) 5 MG tablet Take 5 mg by mouth daily.   apixaban (ELIQUIS) 5 MG TABS tablet Take 5 mg by mouth 2  (two) times daily.   azelastine (ASTELIN) 0.1 % nasal spray Place 1 spray into both nostrils 2 (two) times daily. Use in each nostril as directed   cholecalciferol (D-VI-SOL) 400 UNIT/ML LIQD Take 400 Units by mouth daily.   cyclobenzaprine (FLEXERIL) 5 MG tablet Take 1 tablet (5 mg total) by mouth 3 (three) times daily as needed for muscle spasms.   dexlansoprazole (DEXILANT) 60 MG capsule Take 60 mg by mouth daily.   oxyCODONE-acetaminophen (PERCOCET) 5-325 MG tablet Take 1 tablet by mouth every 4 (four) hours as needed for severe pain.   tamsulosin (FLOMAX) 0.4 MG CAPS capsule Take 0.4 mg by mouth.   [DISCONTINUED] metoprolol succinate (TOPROL-XL) 50 MG 24 hr tablet Take 50 mg by mouth daily. Take with or immediately following a meal.    Social History: Social History   Tobacco Use   Smoking status: Former    Types: Cigarettes    Quit date: 12/21/1978    Years since quitting: 43.5   Smokeless tobacco: Never  Substance Use Topics   Alcohol use: Never    Family Medical History: History reviewed. No pertinent family history.  Physical Examination: Vitals:   07/10/22 1446  BP: (!) 152/84  Pulse: 64  Temp: 98.3 F (36.8 C)    General: Patient is well developed, well nourished, calm, collected, and in no apparent distress. Attention to examination is appropriate.  Neck:   Braced  Respiratory: Patient is breathing without any difficulty.   NEUROLOGICAL:     Awake, alert, oriented to person, place, and time.  Speech is clear and fluent. Fund of knowledge is appropriate.   Cranial Nerves: Pupils equal round and reactive to light.  Facial tone is symmetric.  Facial sensation is symmetric. Shoulder shrug is symmetric. Tongue protrusion is midline.  There is no pronator drift.  ROM of spine: full.    Strength: Side Biceps Triceps Deltoid Interossei Grip Wrist Ext. Wrist Flex.  R '5 5 5 5 5 5 5  '$ L '5 5 5 5 5 5 5   '$ Side Iliopsoas Quads Hamstring PF DF EHL  R '5 5 5 5 5 5  '$ L  '5 5 5 5 5 5   '$ Reflexes are 1+ and symmetric at the biceps, triceps, brachioradialis, patella and achilles.   Hoffman's is absent.   Bilateral upper and lower extremity sensation is intact to light touch.    No evidence of dysmetria noted.  Gait is normal.     Medical Decision Making  Imaging: MRI C spine 06/30/2022 IMPRESSION: 1. Acute bilateral C2 pars fractures (with involvement of the right transverse foramen), and acute C2 spinous process fracture, better delineated on the cervical spine CT performed earlier today. 2. Acute T2 superior endplate vertebral compression fracture (20% height loss). 3. Marrow edema within the superior aspect of the T1 vertebral body (abutting the endplate), which may reflect a nondisplaced acute compression fracture or bone contusion. 4. STIR hyperintense signal abnormality in the region of the nuchal, interspinous and supraspinous ligaments extending from the skull base to the C4-C5 level, suggesting ligamentous injury. 5. Small right atlantoaxial joint effusion. 6. Cervical spondylosis, as outlined. 7. Mild C5-C6 grade 1 retrolisthesis.     Electronically Signed   By: Kellie Simmering D.O.   On: 06/30/2022 14:51  I have personally reviewed the images and agree with the above interpretation.  Assessment and Plan: [name] is a pleasant 71 y.o. male with type I C2 hangman's fracture.  He had x-rays today which show maintained alignment with no displacement.  Will continue his brace for now.  I will see him back in approximately 6 weeks with x-rays.  If he is still doing well and has stable x-rays, I will plan to see him approximately 3 months after his fracture with a CT scan and flexion-extension films to confirm that his fracture has healed.  For now, he will continue to wear his brace is much as possible.  I have released him to take short breaks while he eats.  I spent a total of 15 minutes in this patient's care today. This time was spent  reviewing pertinent records including imaging studies, obtaining and confirming history, performing a directed evaluation, formulating and discussing my recommendations, and documenting the visit within the medical record.      Thank you for involving me in the care of this patient.      Victor Langenbach K. Izora Ribas MD, [facility] Neurosurgery

## 2022-07-10 ENCOUNTER — Ambulatory Visit
Admission: RE | Admit: 2022-07-10 | Discharge: 2022-07-10 | Disposition: A | Discharge status: Home / Self Care | Payer: Medicare PPO | Attending: Neurosurgery | Admitting: Neurosurgery

## 2022-07-10 ENCOUNTER — Encounter: Payer: Self-pay | Admitting: Neurosurgery

## 2022-07-10 ENCOUNTER — Ambulatory Visit
Admission: RE | Admit: 2022-07-10 | Discharge: 2022-07-10 | Disposition: A | Discharge status: Home / Self Care | Payer: Medicare PPO | Source: Ambulatory Visit | Attending: Neurosurgery | Admitting: Neurosurgery

## 2022-07-10 ENCOUNTER — Ambulatory Visit (INDEPENDENT_AMBULATORY_CARE_PROVIDER_SITE_OTHER): Payer: Medicare PPO | Admitting: Neurosurgery

## 2022-07-10 VITALS — BP 152/84 | HR 64 | Temp 98.3°F | Ht 69.0 in | Wt 167.8 lb

## 2022-07-10 DIAGNOSIS — S22020D Wedge compression fracture of second thoracic vertebra, subsequent encounter for fracture with routine healing: Secondary | ICD-10-CM

## 2022-07-10 DIAGNOSIS — S12130A Unspecified traumatic displaced spondylolisthesis of second cervical vertebra, initial encounter for closed fracture: Secondary | ICD-10-CM | POA: Insufficient documentation

## 2022-07-10 DIAGNOSIS — S12130D Unspecified traumatic displaced spondylolisthesis of second cervical vertebra, subsequent encounter for fracture with routine healing: Secondary | ICD-10-CM

## 2022-07-10 DIAGNOSIS — W19XXXD Unspecified fall, subsequent encounter: Secondary | ICD-10-CM | POA: Diagnosis not present

## 2022-08-08 ENCOUNTER — Telehealth: Payer: Self-pay

## 2022-08-08 NOTE — Telephone Encounter (Signed)
-----  Message from Peggyann Shoals sent at 08/08/2022  3:57 PM EST ----- Regarding: return to work sooner Contact: [phone] c2 fracture  He has an appt with Dr.Yarbrough on 08/28/2022. He wants to know if he can go back to work sooner or should he wait for his appt on 08/28/2022. 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 He does have to pull himself up into the truck but the rest is easy. Patient is aware that he will get a call back next week.

## 2022-08-11 NOTE — Telephone Encounter (Signed)
Can you but in an order for xrays then. He will try and have them done this week.

## 2022-08-11 NOTE — Telephone Encounter (Signed)
Patient notified that Dr.Yarbrough would like for patient to wait until his appt on 08/28/2021 for xrays and to discuss return to work statues.

## 2022-08-27 ENCOUNTER — Other Ambulatory Visit: Payer: Self-pay

## 2022-08-27 DIAGNOSIS — S12130D Unspecified traumatic displaced spondylolisthesis of second cervical vertebra, subsequent encounter for fracture with routine healing: Secondary | ICD-10-CM

## 2022-08-28 ENCOUNTER — Ambulatory Visit
Admission: RE | Admit: 2022-08-28 | Discharge: 2022-08-28 | Disposition: A | Discharge status: Home / Self Care | Payer: Medicare PPO | Source: Ambulatory Visit | Attending: Neurosurgery | Admitting: Neurosurgery

## 2022-08-28 ENCOUNTER — Encounter: Payer: Self-pay | Admitting: Neurosurgery

## 2022-08-28 ENCOUNTER — Ambulatory Visit
Admission: RE | Admit: 2022-08-28 | Discharge: 2022-08-28 | Disposition: A | Discharge status: Home / Self Care | Payer: TRICARE For Life (TFL) | Attending: Neurosurgery | Admitting: Neurosurgery

## 2022-08-28 ENCOUNTER — Ambulatory Visit (INDEPENDENT_AMBULATORY_CARE_PROVIDER_SITE_OTHER): Payer: Medicare PPO | Admitting: Neurosurgery

## 2022-08-28 VITALS — BP 128/83 | HR 53 | Ht 69.0 in | Wt 169.0 lb

## 2022-08-28 DIAGNOSIS — S12130D Unspecified traumatic displaced spondylolisthesis of second cervical vertebra, subsequent encounter for fracture with routine healing: Secondary | ICD-10-CM | POA: Insufficient documentation

## 2022-08-28 DIAGNOSIS — W19XXXD Unspecified fall, subsequent encounter: Secondary | ICD-10-CM | POA: Diagnosis not present

## 2022-08-28 NOTE — Progress Notes (Signed)
Referring Physician:  Adin Hector, MD [facility] [place],  [place] [address]  Primary Physician:  Adin Hector, MD  History of Present Illness: 08/28/2022 Jeffery Livingston is doing well.  He has been compliant with his brace.  07/10/2022 Jeffery Livingston is here today with a chief complaint of  neck pain after a fall.  Seen in the ER on 06/30/22.  He has been wearing his brace.  His neck pain is much better.  06/30/2022 Jeffery Livingston is here today with a chief complaint of a fall 5 days ago.  He had a significant fall and immediate onset of neck pain.  He has had some tingling in his left arm with activity but is otherwise at baseline.  He has severe neck pain with flexion.   I have utilized the care everywhere function in epic to review the outside records available from external health systems.  Review of Systems:  A 10 point review of systems is negative, except for the pertinent positives and negatives detailed in the HPI.  Past Medical History: Past Medical History:  Diagnosis Date   AF (atrial fibrillation) (HCC)    Arthritis    BPH (benign prostatic hyperplasia)    Dyspnea    Elevated lipids    GERD (gastroesophageal reflux disease)    Hypertension    Leukopenia     Past Surgical History: Past Surgical History:  Procedure Laterality Date   COLONOSCOPY WITH PROPOFOL N/A 10/27/2017   Procedure: COLONOSCOPY WITH PROPOFOL;  Surgeon: Manya Silvas, MD;  Location: [facility] ENDOSCOPY;  Service: Endoscopy;  Laterality: N/A;   ESOPHAGOGASTRODUODENOSCOPY (EGD) WITH PROPOFOL N/A 10/27/2017   Procedure: ESOPHAGOGASTRODUODENOSCOPY (EGD) WITH PROPOFOL;  Surgeon: Manya Silvas, MD;  Location: [facility] ENDOSCOPY;  Service: Endoscopy;  Laterality: N/A;   total shoulder relacement      Allergies: Allergies as of 08/28/2022   (No Known Allergies)    Medications: Current Meds  Medication Sig   amLODipine (NORVASC) 5 MG tablet Take 5 mg  by mouth daily.   apixaban (ELIQUIS) 5 MG TABS tablet Take 5 mg by mouth 2 (two) times daily.   azelastine (ASTELIN) 0.1 % nasal spray Place 1 spray into both nostrils 2 (two) times daily. Use in each nostril as directed   cholecalciferol (D-VI-SOL) 400 UNIT/ML LIQD Take 400 Units by mouth daily.   cyclobenzaprine (FLEXERIL) 5 MG tablet Take 1 tablet (5 mg total) by mouth 3 (three) times daily as needed for muscle spasms.   dexlansoprazole (DEXILANT) 60 MG capsule Take 60 mg by mouth daily.   oxyCODONE-acetaminophen (PERCOCET) 5-325 MG tablet Take 1 tablet by mouth every 4 (four) hours as needed for severe pain.   tamsulosin (FLOMAX) 0.4 MG CAPS capsule Take 0.4 mg by mouth.    Social History: Social History   Tobacco Use   Smoking status: Former    Types: Cigarettes    Quit date: 12/21/1978    Years since quitting: 43.7   Smokeless tobacco: Never  Substance Use Topics   Alcohol use: Never    Family Medical History: No family history on file.  Physical Examination: Vitals:   08/28/22 1442  BP: 128/83  Pulse: (!) 53    General: Patient is well developed, well nourished, calm, collected, and in no apparent distress. Attention to examination is appropriate.  Neck:   Braced  Respiratory: Patient is breathing without any difficulty.   NEUROLOGICAL:     Awake, alert,  oriented to person, place, and time.  Speech is clear and fluent. Fund of knowledge is appropriate.   Cranial Nerves: Pupils equal round and reactive to light.  Facial tone is symmetric.  Facial sensation is symmetric. Shoulder shrug is symmetric. Tongue protrusion is midline.  There is no pronator drift.  ROM of spine: full.    Strength: Side Biceps Triceps Deltoid Interossei Grip Wrist Ext. Wrist Flex.  R '5 5 5 5 5 5 5  '$ L '5 5 5 5 5 5 5   '$ Side Iliopsoas Quads Hamstring PF DF EHL  R '5 5 5 5 5 5  '$ L '5 5 5 5 5 5   '$ Reflexes are 1+ and symmetric at the biceps, triceps, brachioradialis, patella and achilles.    Hoffman's is absent.   Bilateral upper and lower extremity sensation is intact to light touch.    No evidence of dysmetria noted.  Gait is normal.     Medical Decision Making  Imaging: MRI C spine 06/30/2022 IMPRESSION: 1. Acute bilateral C2 pars fractures (with involvement of the right transverse foramen), and acute C2 spinous process fracture, better delineated on the cervical spine CT performed earlier today. 2. Acute T2 superior endplate vertebral compression fracture (20% height loss). 3. Marrow edema within the superior aspect of the T1 vertebral body (abutting the endplate), which may reflect a nondisplaced acute compression fracture or bone contusion. 4. STIR hyperintense signal abnormality in the region of the nuchal, interspinous and supraspinous ligaments extending from the skull base to the C4-C5 level, suggesting ligamentous injury. 5. Small right atlantoaxial joint effusion. 6. Cervical spondylosis, as outlined. 7. Mild C5-C6 grade 1 retrolisthesis.     Electronically Signed   By: Kellie Simmering D.O.   On: 06/30/2022 14:51  X-ray from August 28, 3544 shows no complications. I have personally reviewed the images and agree with the above interpretation.  Assessment and Plan: Jeffery Livingston is a pleasant 72 y.o. male with type I C2 hangman's fracture.    He had x-rays today which show maintained alignment with no displacement.  Will continue his brace for now.  Flexion-extension x-rays in 4 weeks.  If those are stable, I will clear him from his brace.     I spent a total of 10 minutes in this patient's care today. This time was spent reviewing pertinent records including imaging studies, obtaining and confirming history, performing a directed evaluation, formulating and discussing my recommendations, and documenting the visit within the medical record.      Thank you for involving me in the care of this patient.      Aeriana Speece K. Izora Ribas MD,  [facility] Neurosurgery

## 2022-09-24 ENCOUNTER — Other Ambulatory Visit: Payer: Self-pay

## 2022-09-24 DIAGNOSIS — S12130D Unspecified traumatic displaced spondylolisthesis of second cervical vertebra, subsequent encounter for fracture with routine healing: Secondary | ICD-10-CM

## 2022-09-25 ENCOUNTER — Ambulatory Visit: Payer: TRICARE For Life (TFL) | Admitting: Neurosurgery

## 2022-09-25 ENCOUNTER — Ambulatory Visit
Admission: RE | Admit: 2022-09-25 | Discharge: 2022-09-25 | Disposition: A | Discharge status: Home / Self Care | Payer: TRICARE For Life (TFL) | Source: Ambulatory Visit | Attending: Neurosurgery | Admitting: Neurosurgery

## 2022-09-25 ENCOUNTER — Encounter: Payer: Self-pay | Admitting: Neurosurgery

## 2022-09-25 ENCOUNTER — Ambulatory Visit
Admission: RE | Admit: 2022-09-25 | Discharge: 2022-09-25 | Disposition: A | Discharge status: Home / Self Care | Payer: Medicare PPO | Attending: Neurosurgery | Admitting: Neurosurgery

## 2022-09-25 VITALS — BP 134/78 | Ht 69.0 in | Wt 171.8 lb

## 2022-09-25 DIAGNOSIS — S12130D Unspecified traumatic displaced spondylolisthesis of second cervical vertebra, subsequent encounter for fracture with routine healing: Secondary | ICD-10-CM

## 2022-09-25 DIAGNOSIS — W19XXXD Unspecified fall, subsequent encounter: Secondary | ICD-10-CM | POA: Diagnosis not present

## 2022-09-25 NOTE — Progress Notes (Signed)
Referring Physician:  No referring provider defined for this encounter.  Primary Physician:  Adin Hector, MD  History of Present Illness: 09/25/2022 [name] is doing well.  08/28/2022 [name] is doing well.  He has been compliant with his brace.  07/10/2022 [name] is here today with a chief complaint of  neck pain after a fall.  Seen in the ER on 06/30/22.  He has been wearing his brace.  His neck pain is much better.  06/30/2022 [name] is here today with a chief complaint of a fall 5 days ago.  He had a significant fall and immediate onset of neck pain.  He has had some tingling in his left arm with activity but is otherwise at baseline.  He has severe neck pain with flexion.   I have utilized the care everywhere function in epic to review the outside records available from external health systems.  Review of Systems:  A 10 point review of systems is negative, except for the pertinent positives and negatives detailed in the HPI.  Past Medical History: Past Medical History:  Diagnosis Date   AF (atrial fibrillation) (HCC)    Arthritis    BPH (benign prostatic hyperplasia)    Dyspnea    Elevated lipids    GERD (gastroesophageal reflux disease)    Hypertension    Leukopenia     Past Surgical History: Past Surgical History:  Procedure Laterality Date   COLONOSCOPY WITH PROPOFOL N/A 10/27/2017   Procedure: COLONOSCOPY WITH PROPOFOL;  Surgeon: Manya Silvas, MD;  Location: [facility] ENDOSCOPY;  Service: Endoscopy;  Laterality: N/A;   ESOPHAGOGASTRODUODENOSCOPY (EGD) WITH PROPOFOL N/A 10/27/2017   Procedure: ESOPHAGOGASTRODUODENOSCOPY (EGD) WITH PROPOFOL;  Surgeon: Manya Silvas, MD;  Location: [facility] ENDOSCOPY;  Service: Endoscopy;  Laterality: N/A;   total shoulder relacement      Allergies: Allergies as of 09/25/2022   (No Known Allergies)    Medications: Current Meds  Medication Sig   amLODipine (NORVASC) 5 MG tablet Take 5 mg  by mouth daily.   apixaban (ELIQUIS) 5 MG TABS tablet Take 5 mg by mouth 2 (two) times daily.   azelastine (ASTELIN) 0.1 % nasal spray Place 1 spray into both nostrils 2 (two) times daily. Use in each nostril as directed   cholecalciferol (D-VI-SOL) 400 UNIT/ML LIQD Take 400 Units by mouth daily.   cyclobenzaprine (FLEXERIL) 5 MG tablet Take 1 tablet (5 mg total) by mouth 3 (three) times daily as needed for muscle spasms.   dexlansoprazole (DEXILANT) 60 MG capsule Take 60 mg by mouth daily.   tamsulosin (FLOMAX) 0.4 MG CAPS capsule Take 0.4 mg by mouth.    Social History: Social History   Tobacco Use   Smoking status: Former    Types: Cigarettes    Quit date: 12/21/1978    Years since quitting: 43.7   Smokeless tobacco: Never  Substance Use Topics   Alcohol use: Never    Family Medical History: No family history on file.  Physical Examination: Vitals:   09/25/22 0944  BP: 134/78    General: Patient is well developed, well nourished, calm, collected, and in no apparent distress. Attention to examination is appropriate.  Neck:   Braced  Respiratory: Patient is breathing without any difficulty.   NEUROLOGICAL:     Awake, alert, oriented to person, place, and time.  Speech is clear and fluent. Fund of knowledge is appropriate.   Cranial Nerves: Pupils equal round and reactive to  light.  Facial tone is symmetric.  Facial sensation is symmetric. Shoulder shrug is symmetric. Tongue protrusion is midline.  There is no pronator drift.  ROM of spine: full.    Strength: Side Biceps Triceps Deltoid Interossei Grip Wrist Ext. Wrist Flex.  R '5 5 5 5 5 5 5  '$ L '5 5 5 5 5 5 5   '$ Side Iliopsoas Quads Hamstring PF DF EHL  R '5 5 5 5 5 5  '$ L '5 5 5 5 5 5   '$ Reflexes are 1+ and symmetric at the biceps, triceps, brachioradialis, patella and achilles.   Hoffman's is absent.   Bilateral upper and lower extremity sensation is intact to light touch.    No evidence of dysmetria noted.  Gait  is normal.     Medical Decision Making  Imaging: MRI C spine 06/30/2022 IMPRESSION: 1. Acute bilateral C2 pars fractures (with involvement of the right transverse foramen), and acute C2 spinous process fracture, better delineated on the cervical spine CT performed earlier today. 2. Acute T2 superior endplate vertebral compression fracture (20% height loss). 3. Marrow edema within the superior aspect of the T1 vertebral body (abutting the endplate), which may reflect a nondisplaced acute compression fracture or bone contusion. 4. STIR hyperintense signal abnormality in the region of the nuchal, interspinous and supraspinous ligaments extending from the skull base to the C4-C5 level, suggesting ligamentous injury. 5. Small right atlantoaxial joint effusion. 6. Cervical spondylosis, as outlined. 7. Mild C5-C6 grade 1 retrolisthesis.     Electronically Signed   By: Kellie Simmering D.O.   On: 06/30/2022 14:51  X-ray from February 1, [id number] shows no complications.  Flexion-extension x-rays from September 25, 2022 show no abnormal translation on my measurements.  I have personally reviewed the images and agree with the above interpretation.  Assessment and Plan: [name] is a pleasant 72 y.o. male with type I C2 hangman's fracture.    He had x-rays today which show maintained alignment with no movement on flexion and extension.  I will wait for formal radiology review before we discontinue his brace..  I spent a total of 10 minutes in this patient's care today. This time was spent reviewing pertinent records including imaging studies, obtaining and confirming history, performing a directed evaluation, formulating and discussing my recommendations, and documenting the visit within the medical record.      Thank you for involving me in the care of this patient.      Nasrin Lanzo K. Izora Ribas MD, [facility]

## 2022-09-29 ENCOUNTER — Telehealth: Payer: Self-pay

## 2022-09-29 NOTE — Telephone Encounter (Signed)
Message Received: Today Meade Maw, MD  Berdine Addison, RN Can you let him know the results showed no evidence of abnormal movement on the xrays, so he can discontinue his collar.  Please apolgoize for the length of time it took me to get back - the xray didn't get resulted until Saturday.  [employer]

## 2022-09-29 NOTE — Telephone Encounter (Signed)
Left message to return call.  

## 2022-10-01 NOTE — Telephone Encounter (Signed)
I spoke with Jeffery Livingston about his xray results and Dr Rhea Bleacher recommendation to discontinue his collar.

## 2023-02-27 ENCOUNTER — Ambulatory Visit: Payer: Medicare PPO | Admitting: Anesthesiology

## 2023-02-27 ENCOUNTER — Ambulatory Visit
Admission: RE | Admit: 2023-02-27 | Discharge: 2023-02-27 | Disposition: A | Discharge status: Home / Self Care | Payer: Medicare PPO | Attending: Gastroenterology | Admitting: Gastroenterology

## 2023-02-27 ENCOUNTER — Other Ambulatory Visit: Payer: Self-pay

## 2023-02-27 ENCOUNTER — Encounter: Payer: Self-pay | Admitting: *Deleted

## 2023-02-27 ENCOUNTER — Encounter
Admission: RE | Disposition: A | Discharge status: Home / Self Care | Payer: Self-pay | Source: Home / Self Care | Attending: Gastroenterology

## 2023-02-27 DIAGNOSIS — Z87891 Personal history of nicotine dependence: Secondary | ICD-10-CM | POA: Diagnosis not present

## 2023-02-27 DIAGNOSIS — K640 First degree hemorrhoids: Secondary | ICD-10-CM | POA: Diagnosis not present

## 2023-02-27 DIAGNOSIS — Z1211 Encounter for screening for malignant neoplasm of colon: Secondary | ICD-10-CM | POA: Insufficient documentation

## 2023-02-27 DIAGNOSIS — K449 Diaphragmatic hernia without obstruction or gangrene: Secondary | ICD-10-CM | POA: Insufficient documentation

## 2023-02-27 DIAGNOSIS — I4891 Unspecified atrial fibrillation: Secondary | ICD-10-CM | POA: Insufficient documentation

## 2023-02-27 DIAGNOSIS — I10 Essential (primary) hypertension: Secondary | ICD-10-CM | POA: Diagnosis not present

## 2023-02-27 DIAGNOSIS — N400 Enlarged prostate without lower urinary tract symptoms: Secondary | ICD-10-CM | POA: Diagnosis not present

## 2023-02-27 DIAGNOSIS — Z800 Family history of malignant neoplasm of digestive organs: Secondary | ICD-10-CM | POA: Diagnosis not present

## 2023-02-27 DIAGNOSIS — D123 Benign neoplasm of transverse colon: Secondary | ICD-10-CM | POA: Insufficient documentation

## 2023-02-27 DIAGNOSIS — Z7901 Long term (current) use of anticoagulants: Secondary | ICD-10-CM | POA: Insufficient documentation

## 2023-02-27 DIAGNOSIS — K219 Gastro-esophageal reflux disease without esophagitis: Secondary | ICD-10-CM | POA: Diagnosis present

## 2023-02-27 HISTORY — PX: ESOPHAGOGASTRODUODENOSCOPY (EGD) WITH PROPOFOL: SHX5813

## 2023-02-27 HISTORY — PX: COLONOSCOPY WITH PROPOFOL: SHX5780

## 2023-02-27 SURGERY — COLONOSCOPY WITH PROPOFOL
Anesthesia: General

## 2023-02-27 MED ORDER — SODIUM CHLORIDE 0.9 % IV SOLN: INTRAVENOUS | Status: DC

## 2023-02-27 MED ORDER — LIDOCAINE HCL (PF) 2 % IJ SOLN
INTRAMUSCULAR | Status: DC | PRN
  Administered 2023-02-27: 40 mg via INTRADERMAL

## 2023-02-27 MED ORDER — PROPOFOL 10 MG/ML IV BOLUS
INTRAVENOUS | Status: DC | PRN
  Administered 2023-02-27 (×6): 20 mg via INTRAVENOUS
  Administered 2023-02-27: 40 mg via INTRAVENOUS
  Administered 2023-02-27 (×2): 20 mg via INTRAVENOUS
  Administered 2023-02-27: 40 mg via INTRAVENOUS
  Administered 2023-02-27 (×2): 20 mg via INTRAVENOUS

## 2023-02-27 NOTE — Anesthesia Postprocedure Evaluation (Signed)
Anesthesia Post Note  Patient: Jeffery Livingston  Procedure(s) Performed: COLONOSCOPY WITH PROPOFOL ESOPHAGOGASTRODUODENOSCOPY (EGD) WITH PROPOFOL  Patient location during evaluation: PACU Anesthesia Type: General Level of consciousness: awake and alert, oriented and patient cooperative Pain management: pain level controlled Vital Signs Assessment: post-procedure vital signs reviewed and stable Respiratory status: spontaneous breathing, nonlabored ventilation and respiratory function stable Cardiovascular status: blood pressure returned to baseline and stable Postop Assessment: adequate PO intake Anesthetic complications: no   No notable events documented.   Last Vitals:  Vitals:   02/27/23 1441 02/27/23 1446  BP:  137/79  Pulse: (!) 56 (!) 56  Resp: 13 13  Temp:    SpO2: 100% 100%    Last Pain:  Vitals:   02/27/23 1446  TempSrc:   PainSc: 0-No pain                 Reed Breech

## 2023-02-27 NOTE — Op Note (Signed)
[facility] Patient Name: Jeffery Livingston Procedure Date: 02/27/2023 1:46 PM MRN: [id number] Account #: [id number] Date of Birth: Oct 17, 1950 Admit Type: Outpatient Age: 72 Room: [facility] ENDO ROOM 3 Gender: Male Note Status: Finalized Instrument Name: Upper Endoscope [id number] Procedure:             Upper GI endoscopy Indications:           Gastro-esophageal reflux disease Providers:             Eather Colas MD, MD Medicines:             Monitored Anesthesia Care Complications:         No immediate complications. Estimated blood loss:                         Minimal. Procedure:             Pre-Anesthesia Assessment:                        - Prior to the procedure, a History and Physical was                         performed, and patient medications and allergies were                         reviewed. The patient is competent. The risks and                         benefits of the procedure and the sedation options and                         risks were discussed with the patient. All questions                         were answered and informed consent was obtained.                         Patient identification and proposed procedure were                         verified by the physician, the nurse, the                         anesthesiologist, the anesthetist and the technician                         in the endoscopy suite. Mental Status Examination:                         alert and oriented. Airway Examination: normal                         oropharyngeal airway and neck mobility. Respiratory                         Examination: clear to auscultation. CV Examination:                         normal. Prophylactic Antibiotics: The patient does not  require prophylactic antibiotics. Prior                         Anticoagulants: The patient has taken no anticoagulant                         or antiplatelet agents. ASA Grade  Assessment: III - A                         patient with severe systemic disease. After reviewing                         the risks and benefits, the patient was deemed in                         satisfactory condition to undergo the procedure. The                         anesthesia plan was to use monitored anesthesia care                         (MAC). Immediately prior to administration of                         medications, the patient was re-assessed for adequacy                         to receive sedatives. The heart rate, respiratory                         rate, oxygen saturations, blood pressure, adequacy of                         pulmonary ventilation, and response to care were                         monitored throughout the procedure. The physical                         status of the patient was re-assessed after the                         procedure.                        After obtaining informed consent, the endoscope was                         passed under direct vision. Throughout the procedure,                         the patient's blood pressure, pulse, and oxygen                         saturations were monitored continuously. The Endoscope                         was introduced through the mouth, and advanced to the  second part of duodenum. The upper GI endoscopy was                         accomplished without difficulty. The patient tolerated                         the procedure well. Findings:      A small hiatal hernia was present.      The examined esophagus was normal.      The entire examined stomach was normal.      The examined duodenum was normal. Impression:            - Small hiatal hernia.                        - Normal esophagus.                        - Normal stomach.                        - Normal examined duodenum.                        - No specimens collected. Recommendation:        - Discharge patient to home.                         - Resume previous diet.                        - Continue present medications.                        - Return to referring physician as previously                         scheduled. Procedure Code(s):     --- Professional ---                        [phone], Esophagogastroduodenoscopy, flexible,                         transoral; diagnostic, including collection of                         specimen(s) by brushing or washing, when performed                         (separate procedure) Diagnosis Code(s):     --- Professional ---                        K44.9, Diaphragmatic hernia without obstruction or                         gangrene                        K21.9, Gastro-esophageal reflux disease without                         esophagitis CPT copyright 2022 American Medical Association. All rights reserved.  The codes documented in this report are preliminary and upon coder review may  be revised to meet current compliance requirements. Eather Colas MD, MD 02/27/2023 2:21:25 PM Number of Addenda: 0 Note Initiated On: 02/27/2023 1:46 PM Estimated Blood Loss:  Estimated blood loss was minimal.      [facility]

## 2023-02-27 NOTE — Transfer of Care (Signed)
Immediate Anesthesia Transfer of Care Note  Patient: Jeffery Livingston  Procedure(s) Performed: COLONOSCOPY WITH PROPOFOL ESOPHAGOGASTRODUODENOSCOPY (EGD) WITH PROPOFOL  Patient Location: PACU and Endoscopy Unit  Anesthesia Type:MAC  Level of Consciousness: drowsy  Airway & Oxygen Therapy: Patient Spontanous Breathing and Patient connected to nasal cannula oxygen  Post-op Assessment: Report given to RN and Post -op Vital signs reviewed and stable  Post vital signs: Reviewed and stable  Last Vitals:  Vitals Value Taken Time  BP 111/71   Temp    Pulse 57 02/27/23 1421  Resp 15 02/27/23 1421  SpO2 100 % 02/27/23 1421  Vitals shown include unfiled device data.  Last Pain:  Vitals:   02/27/23 1316  TempSrc: Temporal  PainSc: 0-No pain         Complications: No notable events documented.

## 2023-02-27 NOTE — Op Note (Signed)
[facility] Gastroenterology Patient Name: Jeffery Livingston Procedure Date: 02/27/2023 1:45 PM MRN: [id number] Account #: [id number] Date of Birth: 02-Feb-1951 Admit Type: Outpatient Age: 72 Room: [facility] ENDO ROOM 3 Gender: Male Note Status: Finalized Instrument Name: Prentice Docker [id number] Procedure:             Colonoscopy Indications:           High risk colon cancer surveillance: Personal history                         of colonic polyps, Family history of colon cancer in a                         first-degree relative before age 75 years Providers:             Eather Colas MD, MD Medicines:             Monitored Anesthesia Care Complications:         No immediate complications. Estimated blood loss:                         Minimal. Procedure:             Pre-Anesthesia Assessment:                        - Prior to the procedure, a History and Physical was                         performed, and patient medications and allergies were                         reviewed. The patient is competent. The risks and                         benefits of the procedure and the sedation options and                         risks were discussed with the patient. All questions                         were answered and informed consent was obtained.                         Patient identification and proposed procedure were                         verified by the physician, the nurse, the                         anesthesiologist, the anesthetist and the technician                         in the endoscopy suite. Mental Status Examination:                         alert and oriented. Airway Examination: normal                         oropharyngeal airway and neck mobility. Respiratory  Examination: clear to auscultation. CV Examination:                         normal. Prophylactic Antibiotics: The patient does not                         require prophylactic  antibiotics. Prior                         Anticoagulants: The patient has taken Eliquis                         (apixaban), last dose was 4 days prior to procedure.                         ASA Grade Assessment: III - A patient with severe                         systemic disease. After reviewing the risks and                         benefits, the patient was deemed in satisfactory                         condition to undergo the procedure. The anesthesia                         plan was to use monitored anesthesia care (MAC).                         Immediately prior to administration of medications,                         the patient was re-assessed for adequacy to receive                         sedatives. The heart rate, respiratory rate, oxygen                         saturations, blood pressure, adequacy of pulmonary                         ventilation, and response to care were monitored                         throughout the procedure. The physical status of the                         patient was re-assessed after the procedure.                        After obtaining informed consent, the colonoscope was                         passed under direct vision. Throughout the procedure,                         the patient's blood pressure, pulse, and oxygen  saturations were monitored continuously. The                         Colonoscope was introduced through the anus and                         advanced to the the cecum, identified by appendiceal                         orifice and ileocecal valve. The colonoscopy was                         performed without difficulty. The patient tolerated                         the procedure well. The quality of the bowel                         preparation was good. The ileocecal valve, appendiceal                         orifice, and rectum were photographed. Findings:      The perianal and digital rectal examinations  were normal.      A 3 mm polyp was found in the transverse colon. The polyp was sessile.       The polyp was removed with a cold snare. Resection and retrieval were       complete. Estimated blood loss was minimal.      Internal hemorrhoids were found during retroflexion. The hemorrhoids       were Grade I (internal hemorrhoids that do not prolapse).      The exam was otherwise without abnormality on direct and retroflexion       views. Impression:            - One 3 mm polyp in the transverse colon, removed with                         a cold snare. Resected and retrieved.                        - Internal hemorrhoids.                        - The examination was otherwise normal on direct and                         retroflexion views. Recommendation:        - Discharge patient to home.                        - Resume previous diet.                        - Continue present medications.                        - Await pathology results.                        - Repeat colonoscopy in 5 years for surveillance.                        -  Return to referring physician as previously                         scheduled.                        - Resume Eliquis (apixaban) at prior dose today. Procedure Code(s):     --- Professional ---                        [phone], Colonoscopy, flexible; with removal of                         tumor(s), polyp(s), or other lesion(s) by snare                         technique Diagnosis Code(s):     --- Professional ---                        Z86.010, Personal history of colonic polyps                        D12.3, Benign neoplasm of transverse colon (hepatic                         flexure or splenic flexure)                        K64.0, First degree hemorrhoids                        Z80.0, Family history of malignant neoplasm of                         digestive organs CPT copyright 2022 American Medical Association. All rights reserved. The codes documented  in this report are preliminary and upon coder review may  be revised to meet current compliance requirements. Eather Colas MD, MD 02/27/2023 2:24:21 PM Number of Addenda: 0 Note Initiated On: 02/27/2023 1:45 PM Scope Withdrawal Time: 0 hours 10 minutes 49 seconds  Total Procedure Duration: 0 hours 15 minutes 44 seconds  Estimated Blood Loss:  Estimated blood loss was minimal.      [facility]

## 2023-02-27 NOTE — H&P (Signed)
Outpatient short stay form Pre-procedure 02/27/2023  Jeffery Bill, MD  Primary Physician: Lynnea Ferrier, MD  Reason for visit:  GERD/Personal history of polyps  History of present illness:    72 y/o gentleman with history of hypertension and a. Fib on DOAC with last dose 4 days ago. Brother had colon cancer in his 15's. No significant abdominal surgeries.    Current Facility-Administered Medications:    0.9 %  sodium chloride infusion, , Intravenous, Continuous, Antoinette Haskett, Rossie Muskrat, MD, Last Rate: 20 mL/hr at 02/27/23 1321, New Bag at 02/27/23 1321  Medications Prior to Admission  Medication Sig Dispense Refill Last Dose   amLODipine (NORVASC) 5 MG tablet Take 5 mg by mouth daily.   02/26/2023   cholecalciferol (D-VI-SOL) 400 UNIT/ML LIQD Take 400 Units by mouth daily.   Past Week   cyclobenzaprine (FLEXERIL) 5 MG tablet Take 1 tablet (5 mg total) by mouth 3 (three) times daily as needed for muscle spasms. 60 tablet 0 02/26/2023   dexlansoprazole (DEXILANT) 60 MG capsule Take 60 mg by mouth daily.   02/26/2023   tamsulosin (FLOMAX) 0.4 MG CAPS capsule Take 0.4 mg by mouth.   02/26/2023   apixaban (ELIQUIS) 5 MG TABS tablet Take 5 mg by mouth 2 (two) times daily.   02/23/2023   azelastine (ASTELIN) 0.1 % nasal spray Place 1 spray into both nostrils 2 (two) times daily. Use in each nostril as directed        No Known Allergies   Past Medical History:  Diagnosis Date   AF (atrial fibrillation) (HCC)    Arthritis    BPH (benign prostatic hyperplasia)    Dyspnea    Elevated lipids    GERD (gastroesophageal reflux disease)    Hypertension    Leukopenia     Review of systems:  Otherwise negative.    Physical Exam  Gen: Alert, oriented. Appears stated age.  HEENT: PERRLA. Lungs: No respiratory distress CV: RRR Abd: soft, benign, no masses Ext: No edema    Planned procedures: Proceed with EGD/colonoscopy. The patient understands the nature of the planned procedure,  indications, risks, alternatives and potential complications including but not limited to bleeding, infection, perforation, damage to internal organs and possible oversedation/side effects from anesthesia. The patient agrees and gives consent to proceed.  Please refer to procedure notes for findings, recommendations and patient disposition/instructions.     Jeffery Bill, MD [facility]enter[facility]

## 2023-02-27 NOTE — Interval H&P Note (Signed)
History and Physical Interval Note:  02/27/2023 1:46 PM  Jeffery Livingston  has presented today for surgery, with the diagnosis of FH colon cancer gerd.  The various methods of treatment have been discussed with the patient and family. 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  The patient's history has been reviewed, patient examined, no change in status, stable for surgery.  I have reviewed the patient's chart and labs.  Questions were answered to the patient's satisfaction.     Regis Bill  Ok to proceed with EGD/Colonoscopy

## 2023-02-27 NOTE — Anesthesia Preprocedure Evaluation (Addendum)
Anesthesia Evaluation  Patient identified by MRN, date of birth, ID band Patient awake    Reviewed: Allergy & Precautions, NPO status , Patient's Chart, lab work & pertinent test results  History of Anesthesia Complications Negative for: history of anesthetic complications  Airway Mallampati: IV   Neck ROM: Full    Dental  (+) Missing, Chipped   Pulmonary former smoker (quit 1980)   Pulmonary exam normal breath sounds clear to auscultation       Cardiovascular hypertension, Normal cardiovascular exam+ dysrhythmias (a fib on Eliquis)  Rhythm:Regular Rate:Normal  Echo 10/14/19:  NORMAL LEFT VENTRICULAR SYSTOLIC FUNCTION WITH AN ESTIMATED EF = >55 %  NORMAL RIGHT VENTRICULAR SYSTOLIC FUNCTION  MILD TRICUSPID AND MITRAL VALVE INSUFFICIENCY  NO VALVULAR STENOSIS  MILD RA ENLARGEMENT     Neuro/Psych negative neurological ROS     GI/Hepatic ,GERD  ,,  Endo/Other  negative endocrine ROS    Renal/GU negative Renal ROS   BPH    Musculoskeletal   Abdominal   Peds  Hematology negative hematology ROS (+)   Anesthesia Other Findings Cardiology note 11/11/22:  72 y.o. male with  1. Paroxysmal atrial fibrillation (CMS/HHS-HCC)  2. Hypertension, essential  3. Hyperlipidemia, unspecified hyperlipidemia type  4. Chronic fatigue  5. Encounter for Department of Transportation (DOT) examination for driving license renewal   72 year old gentleman with paroxysmal atrial fibrillation, chads vasc score of 2, on Eliquis for stroke prevention. He reports a life long history of bradycardia. The patient underwent stress echocardiogram, which revealed normal left ventricular function, without evidence for scar or ischemia. The patient reported worsening fatigue and lack of energy, and an episode of near syncope without recurrence. 72-hr Holter monitor revealed predominant sinus rhythm with a mean heart rate of 64 bpm with sinus bradycardia  with a minimum heart rate of 44 bpm. Metoprolol succinate was discontinued, and amlodipine was added. The patient initially reported feeling better, but still has daytime somnolence. Sleep study was negative. He was seen at [facility] for palpitations and worsening weakness, noted to be in rate controlled atrial fibrillation with unremarkable work-up. The patient exercises without chest pain, shortness of breath, presyncope or syncope. His main complaint is generalized fatigue, which is not new.   Plan   1. Continue current medications 2. Continue low-sodium, no added salt diet 3. Continue Eliquis for stroke prevention.  4. Defer resuming AV nodal blockers due to chronic fatigue and bradycardia 5. Continue regular exercise  6. ETT today for DOT physical 7. Return to clinic for follow up in 6 months.  Orders Placed This Encounter  Procedures  CARD stress test only, exercise   Return in about 6 months (around 05/13/2023).    Reproductive/Obstetrics                             Anesthesia Physical Anesthesia Plan  ASA: 3  Anesthesia Plan: General   Post-op Pain Management:    Induction: Intravenous  PONV Risk Score and Plan: 2 and Propofol infusion, TIVA and Treatment may vary due to age or medical condition  Airway Management Planned: Natural Airway  Additional Equipment:   Intra-op Plan:   Post-operative Plan:   Informed Consent: I have reviewed the patients History and Physical, chart, labs and discussed the procedure including the risks, benefits and alternatives for the proposed anesthesia with the patient or authorized representative who has indicated his/her understanding and acceptance.       Plan Discussed with:  CRNA  Anesthesia Plan Comments: (LMA/GETA backup discussed.  Patient consented for risks of anesthesia including but not limited to:  - adverse reactions to medications - damage to eyes, teeth, lips or other oral mucosa - nerve  damage due to positioning  - sore throat or hoarseness - damage to heart, brain, nerves, lungs, other parts of body or loss of life  Informed patient about role of CRNA in peri- and intra-operative care.  Patient voiced understanding.)        Anesthesia Quick Evaluation

## 2023-03-02 ENCOUNTER — Encounter: Payer: Self-pay | Admitting: Gastroenterology

## 2023-07-15 ENCOUNTER — Encounter: Payer: Self-pay | Admitting: Gastroenterology
